# Patient Record
Sex: Male | Born: 1960 | Race: White | Hispanic: No | Marital: Married | Smoking: Former smoker
Health system: Southern US, Community
[De-identification: ages and names within clinical notes are randomized; demographics above are authoritative.]

## PROBLEM LIST (undated history)

## (undated) DIAGNOSIS — M545 Low back pain, unspecified: Secondary | ICD-10-CM

## (undated) DIAGNOSIS — M5410 Radiculopathy, site unspecified: Secondary | ICD-10-CM

## (undated) DIAGNOSIS — Z86010 Personal history of colonic polyps: Secondary | ICD-10-CM

## (undated) DIAGNOSIS — C029 Malignant neoplasm of tongue, unspecified: Secondary | ICD-10-CM

## (undated) HISTORY — DX: Personal history of colonic polyps: Z86.010

## (undated) HISTORY — DX: Low back pain: M54.5

## (undated) HISTORY — DX: Malignant neoplasm of tongue, unspecified: C02.9

## (undated) HISTORY — PX: PARTIAL GLOSSECTOMY: SHX2173

## (undated) HISTORY — PX: SPINE SURGERY: SHX786

## (undated) HISTORY — DX: Radiculopathy, site unspecified: M54.10

## (undated) HISTORY — DX: Low back pain, unspecified: M54.50

## (undated) HISTORY — PX: LUMBAR FUSION: SHX111

## (undated) HISTORY — PX: VASECTOMY: SHX75

---

## 2002-09-15 HISTORY — PX: LIPOMA EXCISION: SHX5283

## 2006-07-21 ENCOUNTER — Ambulatory Visit: Payer: Self-pay | Admitting: Family Medicine

## 2006-07-21 DIAGNOSIS — M5412 Radiculopathy, cervical region: Secondary | ICD-10-CM | POA: Insufficient documentation

## 2006-07-26 ENCOUNTER — Encounter: Payer: Self-pay | Admitting: Family Medicine

## 2006-07-26 LAB — CONVERTED CEMR LAB
Albumin: 4.6 g/dL (ref 3.5–5.2)
Alkaline Phosphatase: 42 units/L (ref 39–117)
BUN: 15 mg/dL (ref 6–23)
CO2: 26 meq/L (ref 19–32)
Cholesterol: 190 mg/dL (ref 0–200)
Glucose, Bld: 83 mg/dL (ref 70–99)
HDL: 49 mg/dL (ref >39)
LDL Cholesterol: 114 mg/dL — ABNORMAL HIGH (ref 0–99)
Potassium: 4.2 meq/L (ref 3.5–5.3)
Sodium: 141 meq/L (ref 135–145)
Total Protein: 7.1 g/dL (ref 6.0–8.3)
Triglycerides: 137 mg/dL (ref <150)

## 2006-07-28 ENCOUNTER — Encounter: Payer: Self-pay | Admitting: Family Medicine

## 2006-08-15 ENCOUNTER — Ambulatory Visit: Payer: Self-pay | Admitting: Family Medicine

## 2008-03-29 ENCOUNTER — Ambulatory Visit: Payer: Self-pay | Admitting: Family Medicine

## 2008-06-06 ENCOUNTER — Encounter: Admission: RE | Admit: 2008-06-06 | Discharge: 2008-06-06 | Payer: Self-pay | Admitting: Family Medicine

## 2008-06-06 ENCOUNTER — Ambulatory Visit: Payer: Self-pay | Admitting: Family Medicine

## 2008-06-07 ENCOUNTER — Encounter: Payer: Self-pay | Admitting: Family Medicine

## 2008-06-07 LAB — CONVERTED CEMR LAB
Albumin: 4.4 g/dL (ref 3.5–5.2)
BUN: 15 mg/dL (ref 6–23)
CO2: 24 meq/L (ref 19–32)
Calcium: 9.1 mg/dL (ref 8.4–10.5)
Chloride: 106 meq/L (ref 96–112)
Cholesterol: 180 mg/dL (ref 0–200)
Glucose, Bld: 85 mg/dL (ref 70–99)
HDL: 46 mg/dL (ref >39)
LDL Cholesterol: 98 mg/dL (ref 0–99)
Potassium: 4 meq/L (ref 3.5–5.3)
Sodium: 141 meq/L (ref 135–145)
Total Protein: 6.9 g/dL (ref 6.0–8.3)
Triglycerides: 181 mg/dL — ABNORMAL HIGH (ref <150)

## 2010-08-29 ENCOUNTER — Encounter: Payer: Self-pay | Admitting: Family Medicine

## 2010-09-05 ENCOUNTER — Ambulatory Visit (INDEPENDENT_AMBULATORY_CARE_PROVIDER_SITE_OTHER): Payer: Managed Care, Other (non HMO) | Admitting: Family Medicine

## 2010-09-05 ENCOUNTER — Encounter: Payer: Self-pay | Admitting: Family Medicine

## 2010-09-05 DIAGNOSIS — Z130 Encounter for screening for diseases of the blood and blood-forming organs and certain disorders involving the immune mechanism: Secondary | ICD-10-CM

## 2010-09-05 DIAGNOSIS — Z1211 Encounter for screening for malignant neoplasm of colon: Secondary | ICD-10-CM

## 2010-09-05 DIAGNOSIS — Z125 Encounter for screening for malignant neoplasm of prostate: Secondary | ICD-10-CM

## 2010-09-05 DIAGNOSIS — Z0000 Encounter for general adult medical examination without abnormal findings: Secondary | ICD-10-CM

## 2010-09-05 DIAGNOSIS — Z13220 Encounter for screening for lipoid disorders: Secondary | ICD-10-CM

## 2010-09-05 DIAGNOSIS — Z1283 Encounter for screening for malignant neoplasm of skin: Secondary | ICD-10-CM

## 2010-09-05 NOTE — Progress Notes (Signed)
  Subjective:    Patient ID: Nathan Stephenson, male    DOB: 05/15/1960, 50 y.o.   MRN: [id number]  HPI  50 yo WM presents for CPE.  He is due for his first screening colonoscopy in [place].  He is previously healthy.  His BP is high for the first time today.  He has gained some more weight.  He is due for prostate cancer screen and fasting labs.  He denies fam hx of heart dz or stroke.  He is not a smoker.  Denies any problems with CP or DOE.  BP 151/87  Pulse 55  Ht 6\' 1"  (1.854 m)  Wt 261 lb (118.389 kg)  BMI 34.43 kg/m2     Review of Systems Gen: no fevers, chills, hot flashes, night sweats, change in weight GI: no N/V/C/D GU: no dysuria, incontinence or sexual dysfunction CV: no chest pain, DOE, palpitations s or edema Pulm:  Denies CP, SOB or chronic cough     Objective:   Physical Exam  Gen: alert, well groomed in NAD Neck: no thyromegaly or cervical lymphadenopathy CV: RRR w/o murmur, no audible carotid bruits or abdominal aortic bruits Ext: no edema, clubbing or cyanosis Lungs: CTA bilat w/o W/R/R; nonlabored HEENT:  [place]/AT; PERRLA; oropharynx pink and moist with good dentition Abd: soft, NT, ND, NABS, No HSM, no audible AA bruits Skin: warm and dry; no rash, pallor or jaundice, AKs around hair line. Psych: does not appear anxious or depressed; answers questions appropriately GU: hemoccult neg w/o anal masses, prostate is not enlarged.  No masses or tenderness.     Assessment & Plan:  Assesment:  1. CPE- Keeping healthy checklist for men  reviewed today.  BP high today for 1st time.l.  BMI 34   in the class I obesity range.     Labs ordered Colonoscopy ordered. Will return for a Tdap and flu shot in October. DRE today/ PSA ordered. Encouraged healthy diet, regular exercise, MVI daily. Return for next physical in 1 yr.

## 2010-09-05 NOTE — Patient Instructions (Addendum)
Update fasting labs one morning downstairs. Will call you w/ results.  Will set up colonoscopy with [facility] Will refer you to derm in [place] for skin cancer screening.  Work on [employer], regular exercise, wt loss.  Return in October for nurse visit: Tdap and flu shot.

## 2010-10-12 LAB — COMPLETE METABOLIC PANEL WITH GFR
Albumin: 4.5 g/dL (ref 3.5–5.2)
Alkaline Phosphatase: 40 U/L (ref 39–117)
BUN: 13 mg/dL (ref 6–23)
CO2: 26 mEq/L (ref 19–32)
Calcium: 9.4 mg/dL (ref 8.4–10.5)
Chloride: 104 mEq/L (ref 96–112)
GFR, Est Non African American: >60 mL/min (ref >60)
Glucose, Bld: 98 mg/dL (ref 70–99)
Potassium: 4.5 mEq/L (ref 3.5–5.3)
Sodium: 141 mEq/L (ref 135–145)
Total Protein: 7 g/dL (ref 6.0–8.3)

## 2010-10-12 LAB — LIPID PANEL: Total CHOL/HDL Ratio: 3.9 Ratio

## 2010-10-19 ENCOUNTER — Encounter: Payer: Self-pay | Admitting: Family Medicine

## 2010-10-23 ENCOUNTER — Encounter: Payer: Self-pay | Admitting: Family Medicine

## 2012-03-09 ENCOUNTER — Ambulatory Visit (INDEPENDENT_AMBULATORY_CARE_PROVIDER_SITE_OTHER): Payer: Managed Care, Other (non HMO) | Admitting: Family Medicine

## 2012-03-09 ENCOUNTER — Encounter: Payer: Self-pay | Admitting: Family Medicine

## 2012-03-09 VITALS — BP 130/83 | HR 99 | Ht 73.0 in | Wt 271.0 lb

## 2012-03-09 DIAGNOSIS — M5412 Radiculopathy, cervical region: Secondary | ICD-10-CM

## 2012-03-09 DIAGNOSIS — E785 Hyperlipidemia, unspecified: Secondary | ICD-10-CM

## 2012-03-09 DIAGNOSIS — Z860100 Personal history of colon polyps, unspecified: Secondary | ICD-10-CM

## 2012-03-09 DIAGNOSIS — L570 Actinic keratosis: Secondary | ICD-10-CM | POA: Insufficient documentation

## 2012-03-09 DIAGNOSIS — Z131 Encounter for screening for diabetes mellitus: Secondary | ICD-10-CM

## 2012-03-09 DIAGNOSIS — Z0000 Encounter for general adult medical examination without abnormal findings: Secondary | ICD-10-CM

## 2012-03-09 HISTORY — DX: Personal history of colonic polyps: Z86.010

## 2012-03-09 HISTORY — DX: Personal history of colon polyps, unspecified: Z86.0100

## 2012-03-09 NOTE — Patient Instructions (Signed)
Dr. Romel Dumond's General Advice Following Your Complete Physical Exam  The Benefits of Regular Exercise: Unless you suffer from an uncontrolled cardiovascular condition, studies strongly suggest that regular exercise and physical activity will add to both the quality and length of your life.  The World Health Organization recommends 150 minutes of moderate intensity aerobic activity every week.  This is best split over 3-4 days a week, and can be as simple as a brisk walk for just over 35 minutes "most days of the week".  This type of exercise has been shown to lower LDL-Cholesterol, lower average blood sugars, lower blood pressure, lower cardiovascular disease risk, improve memory, and increase one's overall sense of wellbeing.  The addition of anaerobic (or "strength training") exercises offers additional benefits including but not limited to increased metabolism, prevention of osteoporosis, and improved overall cholesterol levels.  How Can I Strive For A Low-Fat Diet?: Current guidelines recommend that 25-35 percent of your daily energy (food) intake should come from fats.  One might ask how can this be achieved without having to dissect each meal on a daily basis?  Switch to skim or 1% milk instead of whole milk.  Focus on lean meats such as ground turkey, fresh fish, baked chicken, and lean cuts of beef as your source of dietary protein.  Consume less than 300mg/day of dietary cholesterol.  Limit trans fatty acid consumption primarily by limiting synthetic trans fats such as partially hydrogenated oils (Ex: fried fast foods).  Focus efforts on reducing your intake of "solid" fats (Ex: Butter).  Substitute olive or vegetable oil for solid fats where possible.  Moderation of Salt Intake: Provided you don't carry a diagnosis of congestive heart failure nor renal failure, I recommend a daily allowance of no more than 2300 mg of salt (sodium).  Keeping under this daily goal is associated with a  decreased risk of cardiovascular events, creeping above it can lead to elevated blood pressures and increases your risk of cardiovascular events.  Milligrams (mg) of salt is listed on all nutrition labels, and your daily intake can add up faster than you think.  Most canned and frozen dinners can pack in over half your daily salt allowance in one meal.    Lifestyle Health Risks: Certain lifestyle choices carry specific health risks.  As you may already know, tobacco use has been associated with increasing one's risk of cardiovascular disease, pulmonary disease, numerous cancers, among many other issues.  What you may not know is that there are medications and nicotine replacement strategies that can more than double your chances of successfully quitting.  I would be thrilled to help manage your quitting strategy if you currently use tobacco products.  When it comes to alcohol use, I've yet to find an "ideal" daily allowance.  Provided an individual does not have a medical condition that is exacerbated by alcohol consumption, general guidelines determine "safe drinking" as no more than two standard drinks for a man or no more than one standard drink for a male per day.  However, much debate still exists on whether any amount of alcohol consumption is technically "safe".  My general advice, keep alcohol consumption to a minimum for general health promotion.  If you or others believe that alcohol, tobacco, or recreational drug use is interfering with your life, I would be happy to provide confidential counseling regarding treatment options.  General "Over The Counter" Nutrition Advice: Postmenopausal women should aim for a daily calcium intake of 1200 mg, however a significant   portion of this might already be provided by diets including milk, yogurt, cheese, and other dairy products.  Vitamin D has been shown to help preserve bone density, prevent fatigue, and has even been shown to help reduce falls in the  elderly.  Ensuring a daily intake of 800 Units of Vitamin D is a good place to start to enjoy the above benefits, we can easily check your Vitamin D level to see if you'd potentially benefit from supplementation beyond 800 Units a day.  Folic Acid intake should be of particular concern to women of childbearing age.  Daily consumption of 400-800 mcg of Folic Acid is recommended to minimize the chance of spinal cord defects in a fetus should pregnancy occur.    For many adults, accidents still remain one of the most common culprits when it comes to cause of death.  Some of the simplest but most effective preventitive habits you can adopt include regular seatbelt use, proper helmet use, securing firearms, and regularly testing your smoke and carbon monoxide detectors.  Jerron Niblack B. Naturi Alarid DO [facility] [address], [address] [place], [place] [address] Phone: [phone]  

## 2012-03-09 NOTE — Progress Notes (Signed)
CC: Nathan Stephenson is a 52 y.o. male is here for Annual Exam   Subjective: HPI:  Colonoscopy:10/18/10 Repeat 10/17/2013 Prostate: PSA 0.5 Sept 2012 Discussed screening risks/beneifts with patient on 03/09/2012.  Will hold off on PSA at today's visit.  Influenza Vaccine: declined 03/09/2012 Pneumovax: not indicated until 64 Td/Tdap: Td received 2007 Zoster: will receive at age 9   Patient here for CPE with no acute complaints. Rare alcohol use, no tobacco use, no recreational drug use. No formal exercise plan, tries to watch what he eats   Review of Systems - General ROS: negative for - chills, fever, night sweats, weight gain or weight loss Ophthalmic ROS: negative for - decreased vision Psychological ROS: negative for - anxiety or depression ENT ROS: negative for - hearing change, nasal congestion, tinnitus or allergies Hematological and Lymphatic ROS: negative for - bleeding problems, bruising or swollen lymph nodes Breast ROS: negative Respiratory ROS: no cough, shortness of breath, or wheezing Cardiovascular ROS: no chest pain or dyspnea on exertion Gastrointestinal ROS: no abdominal pain, change in bowel habits, or black or bloody stools Genito-Urinary ROS: negative for - genital discharge, genital ulcers, incontinence or abnormal bleeding from genitals Musculoskeletal ROS: negative for - joint pain or muscle pain Neurological ROS: negative for - headaches or memory loss Dermatological ROS: negative for lumps, mole changes, rash and skin lesion changes   Past Medical History  Diagnosis Date  . Radicular pain     neck  . Personal history of colonic polyps 03/09/2012     Family History  Problem Relation Age of Onset  . Hypertension Mother   . Diabetes Brother      History  Substance Use Topics  . Smoking status: Never Smoker   . Smokeless tobacco: Not on file  . Alcohol Use: 0.5 oz/week    1 drink(s) per week     Objective: Filed Vitals:   03/09/12 1108  BP:  130/83  Pulse: 99   General: No Acute Distress HEENT: Atraumatic, normocephalic, conjunctivae normal without scleral icterus.  No nasal discharge, hearing grossly intact, TMs with good landmarks bilaterally with no middle ear abnormalities, posterior pharynx clear without oral lesions. Neck: Supple, trachea midline, no cervical nor supraclavicular adenopathy. Pulmonary: Clear to auscultation bilaterally without wheezing, rhonchi, nor rales. Cardiac: Regular rate and rhythm.  No murmurs, rubs, nor gallops. No peripheral edema.  2+ peripheral pulses bilaterally. Abdomen: Bowel sounds normal.  No masses.  Non-tender without rebound.  Negative Murphy's sign. GU: Declined MSK: Grossly intact, no signs of weakness.  Full strength throughout upper and lower extremities.  Full ROM in upper and lower extremities.  No midline spinal tenderness. Neuro: Gait unremarkable, CN II-XII grossly intact.  C5-C6 Reflex 2/4 Bilaterally, L4 Reflex 2/4 Bilaterally.  Cerebellar function intact. Skin: No rashes. Mild actinic keratosis right temple Psych: Alert and oriented to person/place/time.  Thought process normal. No anxiety/depression.  Assessment & Plan: Nathan Stephenson was seen today for annual exam.  Diagnoses and associated orders for this visit:  Hyperlipidemia - Lipid panel  Diabetes mellitus screening - BASIC METABOLIC PANEL WITH GFR  Personal history of colonic polyps  Actinic keratosis  CERVICAL RADICULOPATHY    If [place] 10 year risk is 4% or greater Will encourage to start on aspirin. Due for cholesterol screening and screening for type 2 diabetes. He was given stool cards. Discussed returning within 3 months for possible cryotherapy of actinic keratosis on right temple Discussed healthy lifestyle options, diet interventions, exercise interventions to promote wellness  and overall health.  Return in about 3 months (around 06/06/2012).

## 2012-03-10 ENCOUNTER — Telehealth: Payer: Self-pay | Admitting: Family Medicine

## 2012-03-10 LAB — BASIC METABOLIC PANEL WITH GFR
BUN: 12 mg/dL (ref 6–23)
CO2: 29 mEq/L (ref 19–32)
Calcium: 9.5 mg/dL (ref 8.4–10.5)
Chloride: 106 mEq/L (ref 96–112)
Creat: 0.91 mg/dL (ref 0.50–1.35)
GFR, Est Non African American: >89 mL/min

## 2012-03-10 LAB — LIPID PANEL
Cholesterol: 188 mg/dL (ref 0–200)
HDL: 53 mg/dL (ref >39)
Triglycerides: 175 mg/dL — ABNORMAL HIGH (ref <150)
VLDL: 35 mg/dL (ref 0–40)

## 2012-03-10 NOTE — Telephone Encounter (Signed)
Left message to call back  

## 2012-03-10 NOTE — Telephone Encounter (Signed)
Pt notified and voiced understanding 

## 2012-03-10 NOTE — Telephone Encounter (Signed)
Nathan Stephenson, Will you please let Nathan Stephenson know that his cholesterol overall looks pretty good.  His triglycerides were a bit over the upper limit of normal but not to the degree where he needs to consider new medication.  These can easily be lowered with cutting back on saturated fats and increasing weekly cardiovascular exercise, optimally 30-45 minutes of breaking a sweat most days of the week.  His risk of having a heart attack in the next 10 years is estimated to be 4% and while this is quite small it's enough to where he would benefit from taking a baby aspirin on a daily basis.  I'd like him to f/u at his convenience this summer to check his skin on the right temple.

## 2015-02-07 ENCOUNTER — Encounter: Payer: Self-pay | Admitting: Family Medicine

## 2015-02-07 ENCOUNTER — Ambulatory Visit (INDEPENDENT_AMBULATORY_CARE_PROVIDER_SITE_OTHER): Payer: BLUE CROSS/BLUE SHIELD | Admitting: Family Medicine

## 2015-02-07 VITALS — BP 130/85 | HR 64 | Wt 262.0 lb

## 2015-02-07 DIAGNOSIS — Z0000 Encounter for general adult medical examination without abnormal findings: Secondary | ICD-10-CM | POA: Diagnosis not present

## 2015-02-07 DIAGNOSIS — R358 Other polyuria: Secondary | ICD-10-CM | POA: Diagnosis not present

## 2015-02-07 DIAGNOSIS — M5137 Other intervertebral disc degeneration, lumbosacral region: Secondary | ICD-10-CM | POA: Diagnosis not present

## 2015-02-07 DIAGNOSIS — R3589 Other polyuria: Secondary | ICD-10-CM

## 2015-02-07 NOTE — Progress Notes (Signed)
CC: Nathan Stephenson is a 55 y.o. male is here for Annual Exam and Back Pain   Subjective: HPI:  Colonoscopy:10/18/10 Repeat 2015 normal and 5 year clearance provided. Prostate: PSA 0.5 Sept 2012 Discussed screening risks/beneifts with patient today,obtain PSA  Influenza Vaccine: declined Pneumovax: not indicated until 46 Td/Tdap: Td received 2007, up-to-date until the end of the year Zoster: will receive at age 3  He is requesting a referral to Dr. Harl Bowie at [facility] for consideration of back surgery.  Review of Systems - General ROS: negative for - chills, fever, night sweats, weight gain or weight loss Ophthalmic ROS: negative for - decreased vision Psychological ROS: negative for - anxiety or depression ENT ROS: negative for - hearing change, nasal congestion, tinnitus or allergies Hematological and Lymphatic ROS: negative for - bleeding problems, bruising or swollen lymph nodes Breast ROS: negative Respiratory ROS: no cough, shortness of breath, or wheezing Cardiovascular ROS: no chest pain or dyspnea on exertion Gastrointestinal ROS: no abdominal pain, change in bowel habits, or black or bloody stools Genito-Urinary ROS: negative for - genital discharge, genital ulcers, incontinence or abnormal bleeding from genitals, positive for frequent urination Musculoskeletal ROS: negative for - joint pain or muscle pain other than that described above Neurological ROS: negative for - headaches or memory loss Dermatological ROS: negative for lumps, mole changes, rash and skin lesion changes  Past Medical History  Diagnosis Date  . Radicular pain     neck  . Personal history of colonic polyps 03/09/2012    Past Surgical History  Procedure Laterality Date  . Lipoma excision  09/2002  . Vasectomy     Family History  Problem Relation Age of Onset  . Hypertension Mother   . Diabetes Brother     Social History   Social History  . Marital Status: Married    Spouse Name: N/A  .  Number of Children: N/A  . Years of Education: N/A   Occupational History  . Not on file.   Social History Main Topics  . Smoking status: Never Smoker   . Smokeless tobacco: Not on file  . Alcohol Use: 0.5 oz/week    1 drink(s) per week  . Drug Use: No  . Sexual Activity: Not on file   Other Topics Concern  . Not on file   Social History Narrative     Objective: BP 130/85 mmHg  Pulse 64  Wt 262 lb (118.842 kg)  General: No Acute Distress HEENT: Atraumatic, normocephalic, conjunctivae normal without scleral icterus.  No nasal discharge, hearing grossly intact, TMs with good landmarks bilaterally with no middle ear abnormalities, posterior pharynx clear without oral lesions. Neck: Supple, trachea midline, no cervical nor supraclavicular adenopathy. Pulmonary: Clear to auscultation bilaterally without wheezing, rhonchi, nor rales. Cardiac: Regular rate and rhythm.  No murmurs, rubs, nor gallops. No peripheral edema.  2+ peripheral pulses bilaterally. Abdomen: Bowel sounds normal.  No masses.  Non-tender without rebound.  Negative Murphy's sign. MSK: Grossly intact, no signs of weakness.  Full strength throughout upper and lower extremities.  Full ROM in upper and lower extremities.  No midline spinal tenderness. Neuro: Gait unremarkable, CN II-XII grossly intact.  C5-C6 Reflex 2/4 Bilaterally, L4 Reflex 2/4 Bilaterally.  Cerebellar function intact. Skin: No rashes. Psych: Alert and oriented to person/place/time.  Thought process normal. No anxiety/depression.  Assessment & Plan: [name] was seen today for annual exam and back pain.  Diagnoses and all orders for this visit:  Annual physical exam -  Lipid panel -     COMPLETE METABOLIC PANEL WITH GFR -     CBC -     PSA -     Urinalysis, Routine w reflex microscopic  Degeneration of lumbar or lumbosacral intervertebral disc -     Ambulatory referral to Neurosurgery  Polyuria -     Urinalysis, Routine w reflex  microscopic  Healthy lifestyle interventions including but not limited to regular exercise, a healthy low fat diet, moderation of salt intake, the dangers of tobacco/alcohol/recreational drug use, nutrition supplementation, and accident avoidance were discussed with the patient and a handout was provided for future reference.  Return for Follow up will be baased on your lab results today.Marland Kitchen

## 2015-02-08 ENCOUNTER — Telehealth: Payer: Self-pay | Admitting: Family Medicine

## 2015-02-08 LAB — CBC
HEMATOCRIT: 51.6 % (ref 39.0–52.0)
HEMOGLOBIN: 17.4 g/dL — AB (ref 13.0–17.0)
MCH: 30.6 pg (ref 26.0–34.0)
MCHC: 33.7 g/dL (ref 30.0–36.0)
MCV: 90.7 fL (ref 78.0–100.0)
MPV: 11.6 fL (ref 8.6–12.4)
Platelets: 190 10*3/uL (ref 150–400)
RBC: 5.69 MIL/uL (ref 4.22–5.81)
RDW: 13.5 % (ref 11.5–15.5)
WBC: 5.2 10*3/uL (ref 4.0–10.5)

## 2015-02-08 LAB — COMPLETE METABOLIC PANEL WITH GFR
ALT: 19 U/L (ref 9–46)
AST: 19 U/L (ref 10–35)
Albumin: 4.8 g/dL (ref 3.6–5.1)
Alkaline Phosphatase: 39 U/L — ABNORMAL LOW (ref 40–115)
BUN: 13 mg/dL (ref 7–25)
CALCIUM: 9.5 mg/dL (ref 8.6–10.3)
CHLORIDE: 101 mmol/L (ref 98–110)
CO2: 33 mmol/L — AB (ref 20–31)
CREATININE: 0.87 mg/dL (ref 0.70–1.33)
Glucose, Bld: 87 mg/dL (ref 65–99)
POTASSIUM: 4.7 mmol/L (ref 3.5–5.3)
Sodium: 139 mmol/L (ref 135–146)
Total Bilirubin: 1.3 mg/dL — ABNORMAL HIGH (ref 0.2–1.2)
Total Protein: 7.2 g/dL (ref 6.1–8.1)

## 2015-02-08 LAB — LIPID PANEL
CHOL/HDL RATIO: 3.4 ratio (ref <=5.0)
CHOLESTEROL: 207 mg/dL — AB (ref 125–200)
HDL: 61 mg/dL (ref >=40)
LDL CALC: 121 mg/dL (ref <130)
TRIGLYCERIDES: 123 mg/dL (ref <150)
VLDL: 25 mg/dL (ref <30)

## 2015-02-08 LAB — URINALYSIS, ROUTINE W REFLEX MICROSCOPIC
Bilirubin Urine: NEGATIVE
Glucose, UA: NEGATIVE
HGB URINE DIPSTICK: NEGATIVE
KETONES UR: NEGATIVE
LEUKOCYTES UA: NEGATIVE
NITRITE: NEGATIVE
Protein, ur: NEGATIVE
SPECIFIC GRAVITY, URINE: 1.018 (ref 1.001–1.035)
pH: 5.5 (ref 5.0–8.0)

## 2015-02-08 LAB — PSA: PSA: 0.68 ng/mL (ref <=4.00)

## 2015-02-08 MED ORDER — TAMSULOSIN HCL 0.4 MG PO CAPS: 0.4000 mg | ORAL_CAPSULE | Freq: Every day | ORAL | Status: DC

## 2015-02-08 NOTE — Telephone Encounter (Signed)
Will you please let patient know that his PSA prostate test, blood sugar, kidney function, liver function, blood cell counts and cholesterol were all normal.  Also since his urine test was normal I suspect his frequent urination is due to an enlarged prostate which is common for men his age.  I'd recommend starting a medication called tamsulosin to help with this. I'll send it to [facility].  I'd recommend f/u in three months.

## 2015-02-08 NOTE — Telephone Encounter (Signed)
Notified patient of lab results and starting the recommended medication ordered.  He asked about side effects and I read them to him.  He said he would try it.  I told him he needs to follow up in 3 months. He would like for a referral to Dr. Harl Bowie in [place].  He said that he discussed this with you yesterday, and he would get in quicker if you referred him.

## 2015-02-28 DIAGNOSIS — M5137 Other intervertebral disc degeneration, lumbosacral region: Secondary | ICD-10-CM | POA: Insufficient documentation

## 2015-04-27 DIAGNOSIS — Z01810 Encounter for preprocedural cardiovascular examination: Secondary | ICD-10-CM | POA: Diagnosis not present

## 2015-04-27 DIAGNOSIS — R9431 Abnormal electrocardiogram [ECG] [EKG]: Secondary | ICD-10-CM | POA: Diagnosis not present

## 2015-04-27 DIAGNOSIS — R001 Bradycardia, unspecified: Secondary | ICD-10-CM | POA: Diagnosis not present

## 2015-04-27 DIAGNOSIS — M5137 Other intervertebral disc degeneration, lumbosacral region: Secondary | ICD-10-CM | POA: Diagnosis not present

## 2015-05-04 DIAGNOSIS — M532X6 Spinal instabilities, lumbar region: Secondary | ICD-10-CM | POA: Diagnosis not present

## 2015-05-04 DIAGNOSIS — Z6833 Body mass index (BMI) 33.0-33.9, adult: Secondary | ICD-10-CM | POA: Diagnosis not present

## 2015-05-04 DIAGNOSIS — M4005 Postural kyphosis, thoracolumbar region: Secondary | ICD-10-CM | POA: Diagnosis not present

## 2015-05-04 DIAGNOSIS — E669 Obesity, unspecified: Secondary | ICD-10-CM | POA: Diagnosis not present

## 2015-05-04 DIAGNOSIS — M5136 Other intervertebral disc degeneration, lumbar region: Secondary | ICD-10-CM | POA: Diagnosis not present

## 2015-05-04 DIAGNOSIS — M4150 Other secondary scoliosis, site unspecified: Secondary | ICD-10-CM | POA: Diagnosis not present

## 2015-05-04 DIAGNOSIS — Z791 Long term (current) use of non-steroidal anti-inflammatories (NSAID): Secondary | ICD-10-CM | POA: Diagnosis not present

## 2015-05-04 DIAGNOSIS — M40205 Unspecified kyphosis, thoracolumbar region: Secondary | ICD-10-CM | POA: Diagnosis not present

## 2015-05-04 DIAGNOSIS — M5137 Other intervertebral disc degeneration, lumbosacral region: Secondary | ICD-10-CM | POA: Diagnosis not present

## 2015-05-05 DIAGNOSIS — M4180 Other forms of scoliosis, site unspecified: Secondary | ICD-10-CM | POA: Insufficient documentation

## 2015-05-05 DIAGNOSIS — M4150 Other secondary scoliosis, site unspecified: Secondary | ICD-10-CM | POA: Insufficient documentation

## 2015-07-03 DIAGNOSIS — C4441 Basal cell carcinoma of skin of scalp and neck: Secondary | ICD-10-CM | POA: Diagnosis not present

## 2015-07-03 DIAGNOSIS — L821 Other seborrheic keratosis: Secondary | ICD-10-CM | POA: Diagnosis not present

## 2015-07-03 DIAGNOSIS — L570 Actinic keratosis: Secondary | ICD-10-CM | POA: Diagnosis not present

## 2015-07-12 DIAGNOSIS — Z981 Arthrodesis status: Secondary | ICD-10-CM | POA: Diagnosis not present

## 2015-07-12 DIAGNOSIS — M532X6 Spinal instabilities, lumbar region: Secondary | ICD-10-CM | POA: Diagnosis not present

## 2015-07-12 DIAGNOSIS — Z4789 Encounter for other orthopedic aftercare: Secondary | ICD-10-CM | POA: Diagnosis not present

## 2015-10-11 DIAGNOSIS — M9905 Segmental and somatic dysfunction of pelvic region: Secondary | ICD-10-CM | POA: Diagnosis not present

## 2015-10-11 DIAGNOSIS — M25551 Pain in right hip: Secondary | ICD-10-CM | POA: Diagnosis not present

## 2015-10-11 DIAGNOSIS — Q7649 Other congenital malformations of spine, not associated with scoliosis: Secondary | ICD-10-CM | POA: Diagnosis not present

## 2015-10-11 DIAGNOSIS — M9903 Segmental and somatic dysfunction of lumbar region: Secondary | ICD-10-CM | POA: Diagnosis not present

## 2015-10-16 DIAGNOSIS — M9905 Segmental and somatic dysfunction of pelvic region: Secondary | ICD-10-CM | POA: Diagnosis not present

## 2015-10-16 DIAGNOSIS — M9904 Segmental and somatic dysfunction of sacral region: Secondary | ICD-10-CM | POA: Diagnosis not present

## 2015-10-16 DIAGNOSIS — M9903 Segmental and somatic dysfunction of lumbar region: Secondary | ICD-10-CM | POA: Diagnosis not present

## 2015-10-16 DIAGNOSIS — M9902 Segmental and somatic dysfunction of thoracic region: Secondary | ICD-10-CM | POA: Diagnosis not present

## 2015-10-16 DIAGNOSIS — Q7649 Other congenital malformations of spine, not associated with scoliosis: Secondary | ICD-10-CM | POA: Diagnosis not present

## 2015-10-16 DIAGNOSIS — M25551 Pain in right hip: Secondary | ICD-10-CM | POA: Diagnosis not present

## 2015-10-18 DIAGNOSIS — Q7649 Other congenital malformations of spine, not associated with scoliosis: Secondary | ICD-10-CM | POA: Diagnosis not present

## 2015-10-18 DIAGNOSIS — M9905 Segmental and somatic dysfunction of pelvic region: Secondary | ICD-10-CM | POA: Diagnosis not present

## 2015-10-18 DIAGNOSIS — M25551 Pain in right hip: Secondary | ICD-10-CM | POA: Diagnosis not present

## 2015-10-18 DIAGNOSIS — M546 Pain in thoracic spine: Secondary | ICD-10-CM | POA: Diagnosis not present

## 2015-10-18 DIAGNOSIS — M9902 Segmental and somatic dysfunction of thoracic region: Secondary | ICD-10-CM | POA: Diagnosis not present

## 2015-10-18 DIAGNOSIS — M9904 Segmental and somatic dysfunction of sacral region: Secondary | ICD-10-CM | POA: Diagnosis not present

## 2015-10-18 DIAGNOSIS — M9903 Segmental and somatic dysfunction of lumbar region: Secondary | ICD-10-CM | POA: Diagnosis not present

## 2016-01-01 DIAGNOSIS — M438X6 Other specified deforming dorsopathies, lumbar region: Secondary | ICD-10-CM | POA: Diagnosis not present

## 2016-01-01 DIAGNOSIS — L570 Actinic keratosis: Secondary | ICD-10-CM | POA: Diagnosis not present

## 2016-01-01 DIAGNOSIS — M545 Low back pain: Secondary | ICD-10-CM | POA: Diagnosis not present

## 2016-01-01 DIAGNOSIS — M461 Sacroiliitis, not elsewhere classified: Secondary | ICD-10-CM | POA: Diagnosis not present

## 2016-01-01 DIAGNOSIS — L821 Other seborrheic keratosis: Secondary | ICD-10-CM | POA: Diagnosis not present

## 2016-01-01 DIAGNOSIS — Z85828 Personal history of other malignant neoplasm of skin: Secondary | ICD-10-CM | POA: Diagnosis not present

## 2016-01-01 DIAGNOSIS — J019 Acute sinusitis, unspecified: Secondary | ICD-10-CM | POA: Diagnosis not present

## 2016-01-01 DIAGNOSIS — J3489 Other specified disorders of nose and nasal sinuses: Secondary | ICD-10-CM | POA: Diagnosis not present

## 2016-01-01 DIAGNOSIS — Z981 Arthrodesis status: Secondary | ICD-10-CM | POA: Diagnosis not present

## 2016-01-01 DIAGNOSIS — R05 Cough: Secondary | ICD-10-CM | POA: Diagnosis not present

## 2016-01-01 DIAGNOSIS — Z08 Encounter for follow-up examination after completed treatment for malignant neoplasm: Secondary | ICD-10-CM | POA: Diagnosis not present

## 2016-01-01 DIAGNOSIS — R0981 Nasal congestion: Secondary | ICD-10-CM | POA: Diagnosis not present

## 2016-02-08 DIAGNOSIS — M545 Low back pain: Secondary | ICD-10-CM | POA: Diagnosis not present

## 2016-02-08 DIAGNOSIS — G894 Chronic pain syndrome: Secondary | ICD-10-CM | POA: Diagnosis not present

## 2016-02-08 DIAGNOSIS — M5137 Other intervertebral disc degeneration, lumbosacral region: Secondary | ICD-10-CM | POA: Diagnosis not present

## 2016-02-08 DIAGNOSIS — M533 Sacrococcygeal disorders, not elsewhere classified: Secondary | ICD-10-CM | POA: Diagnosis not present

## 2016-02-27 DIAGNOSIS — M545 Low back pain: Secondary | ICD-10-CM | POA: Diagnosis not present

## 2016-02-27 DIAGNOSIS — M461 Sacroiliitis, not elsewhere classified: Secondary | ICD-10-CM | POA: Diagnosis not present

## 2016-02-27 DIAGNOSIS — M533 Sacrococcygeal disorders, not elsewhere classified: Secondary | ICD-10-CM | POA: Diagnosis not present

## 2016-04-10 DIAGNOSIS — Z981 Arthrodesis status: Secondary | ICD-10-CM | POA: Diagnosis not present

## 2016-04-10 DIAGNOSIS — M4186 Other forms of scoliosis, lumbar region: Secondary | ICD-10-CM | POA: Diagnosis not present

## 2016-04-10 DIAGNOSIS — M47896 Other spondylosis, lumbar region: Secondary | ICD-10-CM | POA: Diagnosis not present

## 2016-05-15 ENCOUNTER — Ambulatory Visit (INDEPENDENT_AMBULATORY_CARE_PROVIDER_SITE_OTHER): Payer: BLUE CROSS/BLUE SHIELD | Admitting: Physician Assistant

## 2016-05-15 ENCOUNTER — Encounter: Payer: Self-pay | Admitting: Physician Assistant

## 2016-05-15 VITALS — BP 114/80 | HR 59 | Wt 262.0 lb

## 2016-05-15 DIAGNOSIS — Z0000 Encounter for general adult medical examination without abnormal findings: Secondary | ICD-10-CM | POA: Diagnosis not present

## 2016-05-15 DIAGNOSIS — N529 Male erectile dysfunction, unspecified: Secondary | ICD-10-CM | POA: Diagnosis not present

## 2016-05-15 DIAGNOSIS — Z6834 Body mass index (BMI) 34.0-34.9, adult: Secondary | ICD-10-CM | POA: Diagnosis not present

## 2016-05-15 DIAGNOSIS — E789 Disorder of lipoprotein metabolism, unspecified: Secondary | ICD-10-CM

## 2016-05-15 DIAGNOSIS — Z23 Encounter for immunization: Secondary | ICD-10-CM | POA: Diagnosis not present

## 2016-05-15 DIAGNOSIS — E6609 Other obesity due to excess calories: Secondary | ICD-10-CM | POA: Diagnosis not present

## 2016-05-15 DIAGNOSIS — E66811 Obesity, class 1: Secondary | ICD-10-CM

## 2016-05-15 LAB — COMPLETE METABOLIC PANEL WITH GFR
ALT: 18 U/L (ref 9–46)
AST: 21 U/L (ref 10–35)
Albumin: 4.4 g/dL (ref 3.6–5.1)
Alkaline Phosphatase: 35 U/L — ABNORMAL LOW (ref 40–115)
BUN: 19 mg/dL (ref 7–25)
CALCIUM: 9.4 mg/dL (ref 8.6–10.3)
CHLORIDE: 104 mmol/L (ref 98–110)
CO2: 23 mmol/L (ref 20–31)
CREATININE: 1 mg/dL (ref 0.70–1.33)
GFR, Est African American: >89 mL/min (ref >=60)
GFR, Est Non African American: 84 mL/min (ref >=60)
Glucose, Bld: 90 mg/dL (ref 65–99)
Potassium: 5.3 mmol/L (ref 3.5–5.3)
Sodium: 142 mmol/L (ref 135–146)
TOTAL PROTEIN: 6.7 g/dL (ref 6.1–8.1)
Total Bilirubin: 1.3 mg/dL — ABNORMAL HIGH (ref 0.2–1.2)

## 2016-05-15 LAB — LIPID PANEL W/REFLEX DIRECT LDL
CHOL/HDL RATIO: 2.9 ratio (ref <5.0)
Cholesterol: 209 mg/dL — ABNORMAL HIGH (ref <200)
HDL: 72 mg/dL (ref >40)
LDL-Cholesterol: 115 mg/dL — ABNORMAL HIGH
NON-HDL CHOLESTEROL (CALC): 137 mg/dL — AB (ref <130)
Triglycerides: 112 mg/dL (ref <150)

## 2016-05-15 LAB — CBC
HEMATOCRIT: 50.3 % — AB (ref 38.5–50.0)
HEMOGLOBIN: 17.1 g/dL (ref 13.2–17.1)
MCH: 31.1 pg (ref 27.0–33.0)
MCHC: 34 g/dL (ref 32.0–36.0)
MCV: 91.5 fL (ref 80.0–100.0)
MPV: 10.8 fL (ref 7.5–12.5)
Platelets: 170 10*3/uL (ref 140–400)
RBC: 5.5 MIL/uL (ref 4.20–5.80)
RDW: 13.9 % (ref 11.0–15.0)
WBC: 4.3 10*3/uL (ref 3.8–10.8)

## 2016-05-15 MED ORDER — SILDENAFIL CITRATE 50 MG PO TABS: 25.0000 mg | ORAL_TABLET | Freq: Every day | ORAL | 0 refills | Status: DC | PRN

## 2016-05-15 NOTE — Patient Instructions (Addendum)
Start baby aspirin (81mg ) daily  For ED: - Take 1/2 tablet (25mg ) of Sildenafil 1 hour prior to sexual activity - Drink plenty of water   Physical Activity Recommendations for modifying lipids and lowering blood pressure Engage in aerobic physical activity to reduce LDL-cholesterol, non-HDL-cholesterol, and blood pressure  Frequency: 3-4 sessions per week  Intensity: moderate to vigorous  Duration: 40 minutes on average  Physical Activity Recommendations for secondary prevention 1. Aerobic exercise  Frequency: 3-5 sessions per week  Intensity: 50-80% capacity  Duration: 20 - 60 minutes  Examples: walking, treadmill, cycling, rowing, stair climbing, and arm/leg ergometry  2. Resistance exercise  Frequency: 2-3 sessions per week  Intensity: 10-15 repetitions/set to moderate fatigue  Duration: 1-3 sets of 8-10 upper and lower body exercises  Examples: calisthenics, elastic bands, cuff/hand weights, dumbbels, free weights, wall pulleys, and weight machines  Heart-Healthy Lifestyle  Eating a diet rich in vegetables, fruits and whole grains: also includes low-fat dairy products, poultry, fish, legumes, and nuts; limit intake of sweets, sugar-sweetened beverages and red meats  Getting regular exercise  Maintaining a healthy weight  Not smoking or getting help quitting  Staying on top of your health; for some people, lifestyle changes alone may not be enough to prevent a heart attack or stroke. In these cases, taking a statin at the right dose will most likely be necessary   Fat and Cholesterol Restricted Diet High levels of fat and cholesterol in your blood may lead to various health problems, such as diseases of the heart, blood vessels, gallbladder, liver, and pancreas. Fats are concentrated sources of energy that come in various forms. Certain types of fat, including saturated fat, may be harmful in excess. Cholesterol is a substance needed by your body in small  amounts. Your body makes all the cholesterol it needs. Excess cholesterol comes from the food you eat. When you have high levels of cholesterol and saturated fat in your blood, health problems can develop because the excess fat and cholesterol will gather along the walls of your blood vessels, causing them to narrow. Choosing the right foods will help you control your intake of fat and cholesterol. This will help keep the levels of these substances in your blood within normal limits and reduce your risk of disease. What is my plan? Your health care provider recommends that you:  Limit your fat intake to ______% or less of your total calories per day.  Limit the amount of cholesterol in your diet to less than _________mg per day.  Eat 20-30 grams of fiber each day. What types of fat should I choose?  Choose healthy fats more often. Choose monounsaturated and polyunsaturated fats, such as olive and canola oil, flaxseeds, walnuts, almonds, and seeds.  Eat more omega-3 fats. Good choices include salmon, mackerel, sardines, tuna, flaxseed oil, and ground flaxseeds. Aim to eat fish at least two times a week.  Limit saturated fats. Saturated fats are primarily found in animal products, such as meats, butter, and cream. Plant sources of saturated fats include palm oil, palm kernel oil, and coconut oil.  Avoid foods with partially hydrogenated oils in them. These contain trans fats. Examples of foods that contain trans fats are stick margarine, some tub margarines, cookies, crackers, and other baked goods. What general guidelines do I need to follow? These guidelines for healthy eating will help you control your intake of fat and cholesterol:  Check food labels carefully to identify foods with trans fats or high amounts of saturated fat.  Fill one half of your plate with vegetables and green salads.  Fill one fourth of your plate with whole grains. Look for the word "whole" as the first word in the  ingredient list.  Fill one fourth of your plate with lean protein foods.  Limit fruit to two servings a day. Choose fruit instead of juice.  Eat more foods that contain fiber, such as apples, broccoli, carrots, beans, peas, and barley.  Eat more home-cooked food and less restaurant, buffet, and fast food.  Limit or avoid alcohol.  Limit foods high in starch and sugar.  Limit fried foods.  Cook foods using methods other than frying. Baking, boiling, grilling, and broiling are all great options.  Lose weight if you are overweight. Losing just 5-10% of your initial body weight can help your overall health and prevent diseases such as diabetes and heart disease. What foods can I eat? Grains   Whole grains, such as whole wheat or whole grain breads, crackers, cereals, and pasta. Unsweetened oatmeal, bulgur, barley, quinoa, or brown rice. Corn or whole wheat flour tortillas. Vegetables   Fresh or frozen vegetables (raw, steamed, roasted, or grilled). Green salads. Fruits   All fresh, canned (in natural juice), or frozen fruits. Meats and other protein foods   Ground beef (85% or leaner), grass-fed beef, or beef trimmed of fat. Skinless chicken or [place]. Ground chicken or [place]. Pork trimmed of fat. All fish and seafood. Eggs. Dried beans, peas, or lentils. Unsalted nuts or seeds. Unsalted canned or dry beans. Dairy   Low-fat dairy products, such as skim or 1% milk, 2% or reduced-fat cheeses, low-fat ricotta or cottage cheese, or plain low-fat yo Fats and oils   Tub margarines without trans fats. Light or reduced-fat mayonnaise and salad dressings. Avocado. Olive, canola, sesame, or safflower oils. Natural peanut or almond butter (choose ones without added sugar and oil). The items listed above may not be a complete list of recommended foods or beverages. Contact your dietitian for more options.  Foods to avoid Grains   White bread. White pasta. White rice. Cornbread. Bagels,  pastries, and croissants. Crackers that contain trans fat. Vegetables   White potatoes. Corn. Creamed or fried vegetables. Vegetables in a cheese sauce. Fruits   Dried fruits. Canned fruit in light or heavy syrup. Fruit juice. Meats and other protein foods   Fatty cuts of meat. Ribs, chicken wings, bacon, sausage, bologna, salami, chitterlings, fatback, hot dogs, bratwurst, and packaged luncheon meats. Liver and organ meats. Dairy   Whole or 2% milk, cream, half-and-half, and cream cheese. Whole milk cheeses. Whole-fat or sweetened yogurt. Full-fat cheeses. Nondairy creamers and whipped toppings. Processed cheese, cheese spreads, or cheese curds. Beverages   Alcohol. Sweetened drinks (such as sodas, lemonade, and fruit drinks or punches). Fats and oils   Butter, stick margarine, lard, shortening, ghee, or bacon fat. Coconut, palm kernel, or palm oils. Sweets and desserts   Corn syrup, sugars, honey, and molasses. Candy. Jam and jelly. Syrup. Sweetened cereals. Cookies, pies, cakes, donuts, muffins, and ice cream. The items listed above may not be a complete list of foods and beverages to avoid. Contact your dietitian for more information.  This information is not intended to replace advice given to you by your health care provider. Make sure you discuss any questions you have with your health care provider. Document Released: 12/31/2004 Document Revised: 01/21/2014 Document Reviewed: 03/31/2013 Elsevier Interactive Patient Education  2017 [employer].

## 2016-05-15 NOTE — Progress Notes (Signed)
HPI:                                                                Nathan Stephenson is a 56 y.o. male who presents to [facility]: [place] today for annual physical exam  Current Concerns include erectile dysfunction  Patient reports history of ED. He has tried Viagra and Cialis in the past and states he experienced dizziness. He is wondering if there are any other pharmacologic options.  Health Maintenance Health Maintenance  Topic Date Due  . Hepatitis C Screening  08-27-1960  . TETANUS/TDAP  01/15/2015  . HIV Screening  05/15/2021 (Originally 05/17/1975)  . INFLUENZA VACCINE  08/14/2016  . COLONOSCOPY  12/07/2018    Past Medical History:  Diagnosis Date  . Low back pain   . Personal history of colonic polyps 03/09/2012  . Radicular pain    neck   Past Surgical History:  Procedure Laterality Date  . LIPOMA EXCISION  09/2002  . LUMBAR FUSION     L2-L3  . SPINE SURGERY    . VASECTOMY     Social History  Substance Use Topics  . Smoking status: Never Smoker  . Smokeless tobacco: Not on file  . Alcohol use 0.5 oz/week    1 drink(s) per week   family history includes Aplastic anemia in his brother; Celiac disease in his brother; Diabetes in his brother; Hypertension in his mother; Lung cancer in his mother; Rheum arthritis in his brother.  ROS: negative except as noted in the HPI  Medications: Current Outpatient Prescriptions  Medication Sig Dispense Refill  . sildenafil (VIAGRA) 50 MG tablet Take 0.5 tablets (25 mg total) by mouth daily as needed for erectile dysfunction. 10 tablet 0   No current facility-administered medications for this visit.    No Known Allergies     Objective:  BP 114/80   Pulse (!) 59   Wt 262 lb (118.8 kg)   BMI 34.57 kg/m  Gen: well-groomed, cooperative, not ill-appearing, no distress HEENT: normal conjunctiva, TM's clear, oropharynx clear, moist mucus membranes, no thyromegaly or  tenderness Pulm: Normal work of breathing, normal phonation, clear to auscultation bilaterally CV: Normal rate, regular rhythm, s1 and s2 distinct, no murmurs, clicks or rubs, no carotid bruit GI: abdomen soft, nondistended, nontender, no masses GU: patient deferred Neuro: alert and oriented x 3, EOM's intact, PERRLA, DTR's intact, normal tone, no tremor MSK: moving all extremities, normal gait and station, no peripheral edema Skin: warm and dry, no rashes or lesions on exposed skin Psych: normal affect, euthymic mood, normal speech and thought content   No results found for this or any previous visit (from the past 72 hour(s)). No results found.    Assessment and Plan: 56 y.o. male with   1. Encounter for preventive health examination  - CBC - COMPLETE METABOLIC PANEL WITH GFR - Hepatitis C antibody - Lipid Panel w/reflex Direct LDL - Colonoscopy UTD - declines prostate exam - declines STI/HIV testing  2. Vasculogenic erectile dysfunction, unspecified vasculogenic erectile dysfunction type - recommended patient take 1/2 tablet and encouraged good hydration to avoid orthostasis - sildenafil (VIAGRA) 50 MG tablet; Take 0.5 tablets (25 mg total) by mouth daily as needed for erectile dysfunction.  Dispense:  10 tablet; Refill: 0  3. Need for shingles vaccine - Varicella-zoster vaccine IM  4. Borderline high cholesterol, Obesity (BMI 34) - 30yr ASCVD risk 4.4% - recommended daily baby aspirin to decrease cardiovascular risk - discussed diet and lifestyle changes to lower LDL cholesterol Lab Results  Component Value Date   CHOL 207 (H) 02/07/2015   HDL 61 02/07/2015   LDLCALC 121 02/07/2015   TRIG 123 02/07/2015   CHOLHDL 3.4 02/07/2015    Patient education and anticipatory guidance given Patient agrees with treatment plan Follow-up in 1 year for CPE or sooner as needed  Darlyne Russian PA-C

## 2016-05-16 LAB — HEPATITIS C ANTIBODY: HCV AB: NEGATIVE

## 2016-06-14 DIAGNOSIS — M9902 Segmental and somatic dysfunction of thoracic region: Secondary | ICD-10-CM | POA: Diagnosis not present

## 2016-06-14 DIAGNOSIS — M25551 Pain in right hip: Secondary | ICD-10-CM | POA: Diagnosis not present

## 2016-06-14 DIAGNOSIS — M9903 Segmental and somatic dysfunction of lumbar region: Secondary | ICD-10-CM | POA: Diagnosis not present

## 2016-06-14 DIAGNOSIS — M9905 Segmental and somatic dysfunction of pelvic region: Secondary | ICD-10-CM | POA: Diagnosis not present

## 2016-06-14 DIAGNOSIS — M9904 Segmental and somatic dysfunction of sacral region: Secondary | ICD-10-CM | POA: Diagnosis not present

## 2016-06-14 DIAGNOSIS — Q7649 Other congenital malformations of spine, not associated with scoliosis: Secondary | ICD-10-CM | POA: Diagnosis not present

## 2016-08-20 DIAGNOSIS — Q7649 Other congenital malformations of spine, not associated with scoliosis: Secondary | ICD-10-CM | POA: Diagnosis not present

## 2016-08-29 DIAGNOSIS — M79673 Pain in unspecified foot: Secondary | ICD-10-CM | POA: Diagnosis not present

## 2016-08-29 DIAGNOSIS — R52 Pain, unspecified: Secondary | ICD-10-CM | POA: Diagnosis not present

## 2016-09-12 DIAGNOSIS — S92355B Nondisplaced fracture of fifth metatarsal bone, left foot, initial encounter for open fracture: Secondary | ICD-10-CM | POA: Diagnosis not present

## 2016-09-12 DIAGNOSIS — M79673 Pain in unspecified foot: Secondary | ICD-10-CM | POA: Diagnosis not present

## 2016-10-07 DIAGNOSIS — H6123 Impacted cerumen, bilateral: Secondary | ICD-10-CM | POA: Diagnosis not present

## 2016-10-07 DIAGNOSIS — H60392 Other infective otitis externa, left ear: Secondary | ICD-10-CM | POA: Diagnosis not present

## 2016-12-02 ENCOUNTER — Ambulatory Visit: Payer: BLUE CROSS/BLUE SHIELD | Admitting: Physician Assistant

## 2016-12-02 VITALS — BP 137/91 | HR 66 | Temp 97.7°F | Ht 73.0 in | Wt 258.0 lb

## 2016-12-02 DIAGNOSIS — M278 Other specified diseases of jaws: Secondary | ICD-10-CM

## 2016-12-02 DIAGNOSIS — R220 Localized swelling, mass and lump, head: Secondary | ICD-10-CM | POA: Diagnosis not present

## 2016-12-02 DIAGNOSIS — Z23 Encounter for immunization: Secondary | ICD-10-CM | POA: Diagnosis not present

## 2016-12-02 NOTE — Progress Notes (Signed)
HPI:                                                                Nathan Stephenson is a 56 y.o. 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 Endorses clicking in his jaw and a crooked bite. This has been gradually worsening for a few years. Denies constitutional pain, claudication, or other symptoms.  Past Medical History:  Diagnosis Date  . Low back pain   . Personal history of colonic polyps 03/09/2012  . Radicular pain    neck   Past Surgical History:  Procedure Laterality Date  . LIPOMA EXCISION  09/2002  . LUMBAR FUSION     L2-L3  . SPINE SURGERY    . VASECTOMY     Social History   Tobacco Use  . Smoking status: Never Smoker  . Smokeless tobacco: Never Used  Substance Use Topics  . Alcohol use: Yes    Alcohol/week: 0.5 oz    Types: 1 Standard drinks or equivalent per week   family history includes Aplastic anemia in his brother; Celiac disease in his brother; Diabetes in his brother; Hypertension in his mother; Lung cancer in his mother; Rheum arthritis in his brother.  ROS: negative except as noted in the HPI  Medications: Current Outpatient Medications  Medication Sig Dispense Refill  . sildenafil (VIAGRA) 50 MG tablet Take 0.5 tablets (25 mg total) by mouth daily as needed for erectile dysfunction. 10 tablet 0   No current facility-administered medications for this visit.    No Known Allergies     Objective:  BP (!) 137/91   Pulse 66   Temp 97.7 F (36.5 C) (Oral)   Ht 6\' 1"  (1.854 m)   Wt 258 lb (117 kg)   BMI 34.04 kg/m  Gen:  alert, not ill-appearing, no distress, appropriate for age, obese male HEENT: head normocephalic without obvious abnormality, conjunctiva and cornea clear, trachea midline Pulm: Normal work of breathing, normal  phonation Neuro: alert and oriented x 3, no tremor MSK: extremities atraumatic, normal gait and station Skin: intact, no rashes on exposed skin, no jaundice, no cyanosis  No flowsheet data found.   No results found for this or any previous visit (from the past 72 hour(s)). No results found.    Assessment and Plan: 57 y.o. male with   1. Need for influenza vaccination - Flu Vaccine QUAD 36+ mos IM  2. Mass of jaw - patient will email imaging results at his earliest convenience so that they can be forwarded to ENT - Ambulatory referral to ENT  Patient education and anticipatory guidance given Patient agrees with treatment plan Follow-up as needed if symptoms worsen or fail to improve  Darlyne Russian PA-C

## 2016-12-02 NOTE — Patient Instructions (Signed)
[email or web address] [address] [place], [place] [address] [phone] FAX

## 2016-12-07 ENCOUNTER — Encounter: Payer: Self-pay | Admitting: Physician Assistant

## 2016-12-09 ENCOUNTER — Telehealth: Payer: Self-pay | Admitting: *Deleted

## 2016-12-09 NOTE — Telephone Encounter (Signed)
Left message on patient's voicemail.

## 2016-12-09 NOTE — Telephone Encounter (Signed)
-----   Message from [facility] [place] sent at 12/07/2016  6:51 PM EST ----- Regarding: dental x-ray records Please let Parks know I have still not received an email from him with his x-ray records.

## 2016-12-18 DIAGNOSIS — M2624 Reverse articulation: Secondary | ICD-10-CM | POA: Diagnosis not present

## 2016-12-18 DIAGNOSIS — M2740 Unspecified cyst of jaw: Secondary | ICD-10-CM | POA: Diagnosis not present

## 2017-01-17 DIAGNOSIS — M2740 Unspecified cyst of jaw: Secondary | ICD-10-CM | POA: Diagnosis not present

## 2017-01-17 DIAGNOSIS — M2669 Other specified disorders of temporomandibular joint: Secondary | ICD-10-CM | POA: Diagnosis not present

## 2017-01-22 ENCOUNTER — Ambulatory Visit (INDEPENDENT_AMBULATORY_CARE_PROVIDER_SITE_OTHER): Payer: BLUE CROSS/BLUE SHIELD | Admitting: Family Medicine

## 2017-01-22 ENCOUNTER — Encounter: Payer: Self-pay | Admitting: Family Medicine

## 2017-01-22 VITALS — BP 125/80 | HR 65 | Ht 73.0 in | Wt 259.0 lb

## 2017-01-22 DIAGNOSIS — H60501 Unspecified acute noninfective otitis externa, right ear: Secondary | ICD-10-CM | POA: Diagnosis not present

## 2017-01-22 MED ORDER — NEOMYCIN-POLYMYXIN-HC 3.5-10000-1 OT SOLN: 4.0000 [drp] | Freq: Four times a day (QID) | OTIC | 5 refills | Status: DC

## 2017-01-22 NOTE — Patient Instructions (Signed)
Thank you for coming in today. Use the ear drops 4x daily.  Recheck sooner if needed.  Return as needed.    Otitis Externa Otitis externa is an infection of the outer ear canal. The outer ear canal is the area between the outside of the ear and the eardrum. Otitis externa is sometimes called "swimmer's ear." What are the causes? This condition may be caused by:  Swimming in dirty water.  Moisture in the ear.  An injury to the inside of the ear.  An object stuck in the ear.  A cut or scrape on the outside of the ear.  What increases the risk? This condition is more likely to develop in swimmers. What are the signs or symptoms? The first symptom of this condition is often itching in the ear. Later signs and symptoms include:  Swelling of the ear.  Redness in the ear.  Ear pain. The pain may get worse when you pull on your ear.  Pus coming from the ear.  How is this diagnosed? This condition may be diagnosed by examining the ear and testing fluid from the ear for bacteria and funguses. How is this treated? This condition may be treated with:  Antibiotic ear drops. These are often given for 10-14 days.  Medicine to reduce itching and swelling.  Follow these instructions at home:  If you were prescribed antibiotic ear drops, apply them as told by your health care provider. Do not stop using the antibiotic even if your condition improves.  Take over-the-counter and prescription medicines only as told by your health care provider.  Keep all follow-up visits as told by your health care provider. This is important. How is this prevented?  Keep your ear dry. Use the corner of a towel to dry your ear after you swim or bathe.  Avoid scratching or putting things in your ear. Doing these things can damage the ear canal or remove the protective wax that lines it, which makes it easier for bacteria and funguses to grow.  Avoid swimming in lakes, polluted water, or pools that  may not have the right amount of chlorine.  Consider making ear drops and putting 3 or 4 drops in each ear after you swim. Ask your health care provider about how you can make ear drops. Contact a health care provider if:  You have a fever.  After 3 days your ear is still red, swollen, painful, or draining pus.  Your redness, swelling, or pain gets worse.  You have a severe headache.  You have redness, swelling, pain, or tenderness in the area behind your ear. This information is not intended to replace advice given to you by your health care provider. Make sure you discuss any questions you have with your health care provider. Document Released: 12/31/2004 Document Revised: 02/07/2015 Document Reviewed: 10/10/2014 Elsevier Interactive Patient Education  [employer].

## 2017-01-22 NOTE — Progress Notes (Signed)
Nathan Stephenson is a 57 y.o. male who presents to [facility]: [place] today for right-sided ear pain.  Nathan Stephenson has a several week history of right ear pain and irritation.  He notes a scaly rash on the outside of his ear and pain on the inside associated with decreased hearing.  He denies fevers or chills nausea vomiting or diarrhea.  He has had similar problems like this previously and has been prescribed antibiotic eardrops in the past which has helped some.  He is tried using the antibiotic eardrops (ofloxacin) for this episode which have not helped.   Past Medical History:  Diagnosis Date  . Low back pain   . Personal history of colonic polyps 03/09/2012  . Radicular pain    neck   Past Surgical History:  Procedure Laterality Date  . LIPOMA EXCISION  09/2002  . LUMBAR FUSION     L2-L3  . SPINE SURGERY    . VASECTOMY     Social History   Tobacco Use  . Smoking status: Never Smoker  . Smokeless tobacco: Never Used  Substance Use Topics  . Alcohol use: Yes    Alcohol/week: 0.5 oz    Types: 1 Standard drinks or equivalent per week   family history includes Aplastic anemia in his brother; Celiac disease in his brother; Diabetes in his brother; Hypertension in his mother; Lung cancer in his mother; Rheum arthritis in his brother.  ROS as above:  Medications: Current Outpatient Medications  Medication Sig Dispense Refill  . sildenafil (VIAGRA) 50 MG tablet Take 0.5 tablets (25 mg total) by mouth daily as needed for erectile dysfunction. 10 tablet 0  . neomycin-polymyxin-hydrocortisone (CORTISPORIN) OTIC solution Place 4 drops into the right ear 4 (four) times daily. 10 mL 5   No current facility-administered medications for this visit.    No Known Allergies  Health Maintenance Health Maintenance  Topic Date Due  . TETANUS/TDAP  01/15/2015  . HIV Screening   05/15/2021 (Originally 05/17/1975)  . COLONOSCOPY  12/07/2018  . INFLUENZA VACCINE  Completed  . Hepatitis C Screening  Completed     Exam:  BP 125/80   Pulse 65   Ht 6\' 1"  (1.854 m)   Wt 259 lb (117.5 kg)   BMI 34.17 kg/m  Gen: Well NAD HEENT: EOMI,  MMM right ear canal is scaly and erythematous.  The tympanic membrane is erythematous but no effusion or bulging visualized.  The left tympanic membrane is also mildly erythematous with a mildly erythematous ear canal. Of note cerumen was irrigated prior to this exam. Lungs: Normal work of breathing. CTABL Heart: RRR no MRG Abd: NABS, Soft. Nondistended, Nontender Exts: Brisk capillary refill, warm and well perfused.    No results found for this or any previous visit (from the past 72 hour(s)). No results found.    Assessment and Plan: 57 y.o. male with otitis externa.  Unclear etiology.  I am concerned he may have psoriasis or some other rash type condition causing otitis externa.  Will treat with Cortisporin drops.  If not better refer to ENT.   No orders of the defined types were placed in this encounter.  Meds ordered this encounter  Medications  . neomycin-polymyxin-hydrocortisone (CORTISPORIN) OTIC solution    Sig: Place 4 drops into the right ear 4 (four) times daily.    Dispense:  10 mL    Refill:  5     Discussed warning signs  or symptoms. Please see discharge instructions. Patient expresses understanding.

## 2017-07-31 DIAGNOSIS — R52 Pain, unspecified: Secondary | ICD-10-CM | POA: Diagnosis not present

## 2017-07-31 DIAGNOSIS — M65342 Trigger finger, left ring finger: Secondary | ICD-10-CM | POA: Diagnosis not present

## 2018-08-20 DIAGNOSIS — M65342 Trigger finger, left ring finger: Secondary | ICD-10-CM | POA: Diagnosis not present

## 2018-09-07 DIAGNOSIS — Z01812 Encounter for preprocedural laboratory examination: Secondary | ICD-10-CM | POA: Diagnosis not present

## 2018-09-07 DIAGNOSIS — Z20828 Contact with and (suspected) exposure to other viral communicable diseases: Secondary | ICD-10-CM | POA: Diagnosis not present

## 2018-09-07 DIAGNOSIS — M65342 Trigger finger, left ring finger: Secondary | ICD-10-CM | POA: Diagnosis not present

## 2018-09-11 DIAGNOSIS — M65342 Trigger finger, left ring finger: Secondary | ICD-10-CM | POA: Diagnosis not present

## 2018-09-11 DIAGNOSIS — Z79899 Other long term (current) drug therapy: Secondary | ICD-10-CM | POA: Diagnosis not present

## 2018-09-11 DIAGNOSIS — E785 Hyperlipidemia, unspecified: Secondary | ICD-10-CM | POA: Diagnosis not present

## 2018-09-16 DIAGNOSIS — M1711 Unilateral primary osteoarthritis, right knee: Secondary | ICD-10-CM | POA: Diagnosis not present

## 2018-09-16 DIAGNOSIS — R52 Pain, unspecified: Secondary | ICD-10-CM | POA: Diagnosis not present

## 2018-09-16 DIAGNOSIS — M65342 Trigger finger, left ring finger: Secondary | ICD-10-CM | POA: Diagnosis not present

## 2018-10-14 DIAGNOSIS — M7542 Impingement syndrome of left shoulder: Secondary | ICD-10-CM | POA: Diagnosis not present

## 2018-10-14 DIAGNOSIS — M65342 Trigger finger, left ring finger: Secondary | ICD-10-CM | POA: Diagnosis not present

## 2019-01-22 DIAGNOSIS — M1711 Unilateral primary osteoarthritis, right knee: Secondary | ICD-10-CM | POA: Diagnosis not present

## 2019-03-24 DIAGNOSIS — M1711 Unilateral primary osteoarthritis, right knee: Secondary | ICD-10-CM | POA: Diagnosis not present

## 2019-07-14 DIAGNOSIS — M1711 Unilateral primary osteoarthritis, right knee: Secondary | ICD-10-CM | POA: Diagnosis not present

## 2019-08-25 DIAGNOSIS — M1711 Unilateral primary osteoarthritis, right knee: Secondary | ICD-10-CM | POA: Diagnosis not present

## 2019-08-27 DIAGNOSIS — C021 Malignant neoplasm of border of tongue: Secondary | ICD-10-CM | POA: Diagnosis not present

## 2019-09-02 ENCOUNTER — Other Ambulatory Visit: Payer: Self-pay

## 2019-09-02 ENCOUNTER — Ambulatory Visit (INDEPENDENT_AMBULATORY_CARE_PROVIDER_SITE_OTHER): Payer: BC Managed Care – PPO | Admitting: Family Medicine

## 2019-09-02 ENCOUNTER — Encounter: Payer: Self-pay | Admitting: Family Medicine

## 2019-09-02 VITALS — BP 113/53 | HR 47 | Temp 97.8°F | Ht 72.84 in | Wt 232.0 lb

## 2019-09-02 DIAGNOSIS — Z125 Encounter for screening for malignant neoplasm of prostate: Secondary | ICD-10-CM

## 2019-09-02 DIAGNOSIS — R001 Bradycardia, unspecified: Secondary | ICD-10-CM | POA: Diagnosis not present

## 2019-09-02 DIAGNOSIS — E789 Disorder of lipoprotein metabolism, unspecified: Secondary | ICD-10-CM

## 2019-09-02 DIAGNOSIS — E559 Vitamin D deficiency, unspecified: Secondary | ICD-10-CM

## 2019-09-02 DIAGNOSIS — K1370 Unspecified lesions of oral mucosa: Secondary | ICD-10-CM

## 2019-09-02 DIAGNOSIS — R42 Dizziness and giddiness: Secondary | ICD-10-CM | POA: Diagnosis not present

## 2019-09-02 DIAGNOSIS — C021 Malignant neoplasm of border of tongue: Secondary | ICD-10-CM | POA: Diagnosis not present

## 2019-09-02 LAB — COMPLETE METABOLIC PANEL WITH GFR
AG Ratio: 2.3 (calc) (ref 1.0–2.5)
ALT: 15 U/L (ref 9–46)
AST: 18 U/L (ref 10–35)
Albumin: 4.3 g/dL (ref 3.6–5.1)
Alkaline phosphatase (APISO): 34 U/L — ABNORMAL LOW (ref 35–144)
BUN: 15 mg/dL (ref 7–25)
CO2: 29 mmol/L (ref 20–32)
Calcium: 9.3 mg/dL (ref 8.6–10.3)
Chloride: 106 mmol/L (ref 98–110)
Creat: 0.85 mg/dL (ref 0.70–1.25)
GFR, Est African American: 110 mL/min/{1.73_m2} (ref >=60)
GFR, Est Non African American: 95 mL/min/{1.73_m2} (ref >=60)
Globulin: 1.9 g/dL (calc) (ref 1.9–3.7)
Glucose, Bld: 93 mg/dL (ref 65–99)
Potassium: 4.5 mmol/L (ref 3.5–5.3)
Sodium: 139 mmol/L (ref 135–146)
Total Bilirubin: 1.3 mg/dL — ABNORMAL HIGH (ref 0.2–1.2)
Total Protein: 6.2 g/dL (ref 6.1–8.1)

## 2019-09-02 LAB — TSH: TSH: 1.85 mIU/L (ref 0.40–4.50)

## 2019-09-02 LAB — CBC
HCT: 49.2 % (ref 38.5–50.0)
Hemoglobin: 16.8 g/dL (ref 13.2–17.1)
MCH: 31.1 pg (ref 27.0–33.0)
MCHC: 34.1 g/dL (ref 32.0–36.0)
MCV: 90.9 fL (ref 80.0–100.0)
MPV: 11.7 fL (ref 7.5–12.5)
Platelets: 162 10*3/uL (ref 140–400)
RBC: 5.41 10*6/uL (ref 4.20–5.80)
RDW: 12.3 % (ref 11.0–15.0)
WBC: 4.1 10*3/uL (ref 3.8–10.8)

## 2019-09-02 LAB — LIPID PANEL
Cholesterol: 186 mg/dL (ref <200)
HDL: 52 mg/dL (ref >=40)
LDL Cholesterol (Calc): 117 mg/dL (calc) — ABNORMAL HIGH
Non-HDL Cholesterol (Calc): 134 mg/dL (calc) — ABNORMAL HIGH (ref <130)
Total CHOL/HDL Ratio: 3.6 (calc) (ref <5.0)
Triglycerides: 78 mg/dL (ref <150)

## 2019-09-02 LAB — PSA: PSA: 0.6 ng/mL (ref <=4.0)

## 2019-09-02 LAB — VITAMIN D 25 HYDROXY (VIT D DEFICIENCY, FRACTURES): Vit D, 25-Hydroxy: 26 ng/mL — ABNORMAL LOW (ref 30–100)

## 2019-09-02 NOTE — Assessment & Plan Note (Signed)
More vertiginous in nature.  He can try OTC meclizine as needed.   If symptoms persist we may need to obtain imaging.

## 2019-09-02 NOTE — Assessment & Plan Note (Signed)
EKG with bradycardia, rate 42 BPM.  No other ischemic features noted.  Checking TSH and electrolytes, if normal will refer to cardiology.

## 2019-09-02 NOTE — Assessment & Plan Note (Signed)
Recent biopsy, pending results.

## 2019-09-02 NOTE — Progress Notes (Signed)
Nathan Stephenson - 59 y.o. male MRN [id number]  Date of birth: May 30, 1960  Subjective Chief Complaint  Patient presents with  . Establish Care    HPI Nathan Stephenson is a 59 y.o. male here today for initial visit.  He is concerned about lesion on tongue.  He first noticed a few weeks ago, area was sore and non-healing.  He saw his dentist who referred him to an oral surgeon.  He had biopsy last week, results are pending.  Area has healed well after biopsy.  He is very anxious about the results.  He did also have an enlarged lymph node in his neck.  Oral surgeon started him on amoxicillin and he has had improvement of this.    He also reports some intermittent feeling of dizziness.  Sensation is feeling like room is spinning around him rather than presyncopal.  He denies palpitations, nausea, or vision changes.    ROS:  A comprehensive ROS was completed and negative except as noted per HPI  No Known Allergies  Past Medical History:  Diagnosis Date  . Low back pain   . Personal history of colonic polyps 03/09/2012  . Radicular pain    neck    Past Surgical History:  Procedure Laterality Date  . LIPOMA EXCISION  09/2002  . LUMBAR FUSION     L2-L3  . SPINE SURGERY    . VASECTOMY      Social History   Socioeconomic History  . Marital status: Married    Spouse name: Not on file  . Number of children: Not on file  . Years of education: Not on file  . Highest education level: Not on file  Occupational History  . Not on file  Tobacco Use  . Smoking status: Former Research scientist (life sciences)  . Smokeless tobacco: Never Used  Vaping Use  . Vaping Use: Never used  Substance and Sexual Activity  . Alcohol use: Yes    Alcohol/week: 2.0 - 3.0 standard drinks    Types: 2 - 3 Standard drinks or equivalent per week  . Drug use: No  . Sexual activity: Yes    Birth control/protection: None    Comment: wife  Other Topics Concern  . Not on file  Social History Narrative  . Not on file   Social  Determinants of Health   Financial Resource Strain:   . Difficulty of Paying Living Expenses: Not on file  Food Insecurity:   . Worried About Charity fundraiser in the Last Year: Not on file  . Ran Out of Food in the Last Year: Not on file  Transportation Needs:   . Lack of Transportation (Medical): Not on file  . Lack of Transportation (Non-Medical): Not on file  Physical Activity:   . Days of Exercise per Week: Not on file  . Minutes of Exercise per Session: Not on file  Stress:   . Feeling of Stress : Not on file  Social Connections:   . Frequency of Communication with Friends and Family: Not on file  . Frequency of Social Gatherings with Friends and Family: Not on file  . Attends Religious Services: Not on file  . Active Member of Clubs or Organizations: Not on file  . Attends Archivist Meetings: Not on file  . Marital Status: Not on file    Family History  Problem Relation Age of Onset  . Hypertension Mother   . Lung cancer Mother   . Diabetes Brother   .  Aplastic anemia Brother   . Celiac disease Brother   . Rheum arthritis Brother     Health Maintenance  Topic Date Due  . TETANUS/TDAP  01/15/2015  . COLONOSCOPY  12/07/2018  . INFLUENZA VACCINE  08/15/2019  . HIV Screening  05/15/2021 (Originally 05/17/1974)  . COVID-19 Vaccine  Completed  . Hepatitis C Screening  Completed     ----------------------------------------------------------------------------------------------------------------------------------------------------------------------------------------------------------------- Physical Exam BP (!) 113/53 (BP Location: Left Arm, Patient Position: Sitting, Cuff Size: Large)   Pulse (!) 47   Temp 97.8 F (36.6 C) (Temporal)   Ht 6' 0.84" (1.85 m)   Wt 232 lb (105.2 kg)   SpO2 99%   BMI 30.75 kg/m   Physical Exam Constitutional:      Appearance: Normal appearance.  HENT:     Head: Normocephalic and atraumatic.  Eyes:     General: No  scleral icterus. Cardiovascular:     Rate and Rhythm: Regular rhythm. Bradycardia present.  Pulmonary:     Effort: Pulmonary effort is normal.     Breath sounds: Normal breath sounds.  Musculoskeletal:     Cervical back: Neck supple.  Neurological:     General: No focal deficit present.     Mental Status: He is alert.  Psychiatric:        Mood and Affect: Mood normal.        Behavior: Behavior normal.     ------------------------------------------------------------------------------------------------------------------------------------------------------------------------------------------------------------------- Assessment and Plan  Dizziness More vertiginous in nature.  He can try OTC meclizine as needed.   If symptoms persist we may need to obtain imaging.  Bradycardia EKG with bradycardia, rate 42 BPM.  No other ischemic features noted.  Checking TSH and electrolytes, if normal will refer to cardiology.   Oral lesion Recent biopsy, pending results.     No orders of the defined types were placed in this encounter.   No follow-ups on file.    This visit occurred during the SARS-CoV-2 public health emergency.  Safety protocols were in place, including screening questions prior to the visit, additional usage of staff PPE, and extensive cleaning of exam room while observing appropriate contact time as indicated for disinfecting solutions.

## 2019-09-02 NOTE — Patient Instructions (Addendum)
Your heart rate is low today. EKG doesn't show any obvious abnormalities other than slow heart rate.  If labs are normal I would like for you to see a cardiologist.  Have labs completed.  We'll be in touch with results and recommendations.

## 2019-09-03 ENCOUNTER — Other Ambulatory Visit: Payer: Self-pay | Admitting: Family Medicine

## 2019-09-03 DIAGNOSIS — R001 Bradycardia, unspecified: Secondary | ICD-10-CM

## 2019-09-03 DIAGNOSIS — R0602 Shortness of breath: Secondary | ICD-10-CM | POA: Diagnosis not present

## 2019-09-07 DIAGNOSIS — C029 Malignant neoplasm of tongue, unspecified: Secondary | ICD-10-CM | POA: Diagnosis not present

## 2019-09-08 NOTE — Addendum Note (Signed)
Addended by: Narda Rutherford on: 09/08/2019 08:38 AM   Modules accepted: Orders

## 2019-09-15 DIAGNOSIS — I700 Atherosclerosis of aorta: Secondary | ICD-10-CM | POA: Diagnosis not present

## 2019-09-15 DIAGNOSIS — C029 Malignant neoplasm of tongue, unspecified: Secondary | ICD-10-CM | POA: Diagnosis not present

## 2019-09-15 DIAGNOSIS — I2510 Atherosclerotic heart disease of native coronary artery without angina pectoris: Secondary | ICD-10-CM | POA: Diagnosis not present

## 2019-09-15 DIAGNOSIS — J439 Emphysema, unspecified: Secondary | ICD-10-CM | POA: Diagnosis not present

## 2019-09-24 DIAGNOSIS — C021 Malignant neoplasm of border of tongue: Secondary | ICD-10-CM | POA: Diagnosis not present

## 2019-09-24 DIAGNOSIS — C029 Malignant neoplasm of tongue, unspecified: Secondary | ICD-10-CM | POA: Diagnosis not present

## 2019-10-08 DIAGNOSIS — C023 Malignant neoplasm of anterior two-thirds of tongue, part unspecified: Secondary | ICD-10-CM | POA: Diagnosis not present

## 2019-10-08 DIAGNOSIS — C029 Malignant neoplasm of tongue, unspecified: Secondary | ICD-10-CM | POA: Diagnosis not present

## 2019-10-18 DIAGNOSIS — C023 Malignant neoplasm of anterior two-thirds of tongue, part unspecified: Secondary | ICD-10-CM | POA: Diagnosis not present

## 2019-10-18 DIAGNOSIS — C020 Malignant neoplasm of dorsal surface of tongue: Secondary | ICD-10-CM | POA: Diagnosis not present

## 2019-10-18 DIAGNOSIS — Z510 Encounter for antineoplastic radiation therapy: Secondary | ICD-10-CM | POA: Diagnosis not present

## 2019-11-03 DIAGNOSIS — Z510 Encounter for antineoplastic radiation therapy: Secondary | ICD-10-CM | POA: Diagnosis not present

## 2019-11-03 DIAGNOSIS — C023 Malignant neoplasm of anterior two-thirds of tongue, part unspecified: Secondary | ICD-10-CM | POA: Diagnosis not present

## 2019-11-03 DIAGNOSIS — C020 Malignant neoplasm of dorsal surface of tongue: Secondary | ICD-10-CM | POA: Diagnosis not present

## 2019-11-08 DIAGNOSIS — Z510 Encounter for antineoplastic radiation therapy: Secondary | ICD-10-CM | POA: Diagnosis not present

## 2019-11-08 DIAGNOSIS — C023 Malignant neoplasm of anterior two-thirds of tongue, part unspecified: Secondary | ICD-10-CM | POA: Diagnosis not present

## 2019-11-08 DIAGNOSIS — C020 Malignant neoplasm of dorsal surface of tongue: Secondary | ICD-10-CM | POA: Diagnosis not present

## 2019-11-09 DIAGNOSIS — Z510 Encounter for antineoplastic radiation therapy: Secondary | ICD-10-CM | POA: Diagnosis not present

## 2019-11-09 DIAGNOSIS — C020 Malignant neoplasm of dorsal surface of tongue: Secondary | ICD-10-CM | POA: Diagnosis not present

## 2019-11-09 DIAGNOSIS — C023 Malignant neoplasm of anterior two-thirds of tongue, part unspecified: Secondary | ICD-10-CM | POA: Diagnosis not present

## 2019-11-10 DIAGNOSIS — C020 Malignant neoplasm of dorsal surface of tongue: Secondary | ICD-10-CM | POA: Diagnosis not present

## 2019-11-10 DIAGNOSIS — C023 Malignant neoplasm of anterior two-thirds of tongue, part unspecified: Secondary | ICD-10-CM | POA: Diagnosis not present

## 2019-11-10 DIAGNOSIS — Z510 Encounter for antineoplastic radiation therapy: Secondary | ICD-10-CM | POA: Diagnosis not present

## 2019-11-11 DIAGNOSIS — C023 Malignant neoplasm of anterior two-thirds of tongue, part unspecified: Secondary | ICD-10-CM | POA: Diagnosis not present

## 2019-11-11 DIAGNOSIS — Z510 Encounter for antineoplastic radiation therapy: Secondary | ICD-10-CM | POA: Diagnosis not present

## 2019-11-11 DIAGNOSIS — C020 Malignant neoplasm of dorsal surface of tongue: Secondary | ICD-10-CM | POA: Diagnosis not present

## 2019-11-12 DIAGNOSIS — C023 Malignant neoplasm of anterior two-thirds of tongue, part unspecified: Secondary | ICD-10-CM | POA: Diagnosis not present

## 2019-11-12 DIAGNOSIS — C020 Malignant neoplasm of dorsal surface of tongue: Secondary | ICD-10-CM | POA: Diagnosis not present

## 2019-11-12 DIAGNOSIS — Z510 Encounter for antineoplastic radiation therapy: Secondary | ICD-10-CM | POA: Diagnosis not present

## 2019-11-15 DIAGNOSIS — C020 Malignant neoplasm of dorsal surface of tongue: Secondary | ICD-10-CM | POA: Diagnosis not present

## 2019-11-15 DIAGNOSIS — C023 Malignant neoplasm of anterior two-thirds of tongue, part unspecified: Secondary | ICD-10-CM | POA: Diagnosis not present

## 2019-11-15 DIAGNOSIS — Z510 Encounter for antineoplastic radiation therapy: Secondary | ICD-10-CM | POA: Diagnosis not present

## 2019-11-16 DIAGNOSIS — C023 Malignant neoplasm of anterior two-thirds of tongue, part unspecified: Secondary | ICD-10-CM | POA: Diagnosis not present

## 2019-11-16 DIAGNOSIS — Z510 Encounter for antineoplastic radiation therapy: Secondary | ICD-10-CM | POA: Diagnosis not present

## 2019-11-16 DIAGNOSIS — C020 Malignant neoplasm of dorsal surface of tongue: Secondary | ICD-10-CM | POA: Diagnosis not present

## 2019-11-17 DIAGNOSIS — C020 Malignant neoplasm of dorsal surface of tongue: Secondary | ICD-10-CM | POA: Diagnosis not present

## 2019-11-17 DIAGNOSIS — C023 Malignant neoplasm of anterior two-thirds of tongue, part unspecified: Secondary | ICD-10-CM | POA: Diagnosis not present

## 2019-11-17 DIAGNOSIS — Z510 Encounter for antineoplastic radiation therapy: Secondary | ICD-10-CM | POA: Diagnosis not present

## 2019-11-18 DIAGNOSIS — C023 Malignant neoplasm of anterior two-thirds of tongue, part unspecified: Secondary | ICD-10-CM | POA: Diagnosis not present

## 2019-11-18 DIAGNOSIS — C020 Malignant neoplasm of dorsal surface of tongue: Secondary | ICD-10-CM | POA: Diagnosis not present

## 2019-11-18 DIAGNOSIS — Z510 Encounter for antineoplastic radiation therapy: Secondary | ICD-10-CM | POA: Diagnosis not present

## 2019-11-19 DIAGNOSIS — Z510 Encounter for antineoplastic radiation therapy: Secondary | ICD-10-CM | POA: Diagnosis not present

## 2019-11-19 DIAGNOSIS — C020 Malignant neoplasm of dorsal surface of tongue: Secondary | ICD-10-CM | POA: Diagnosis not present

## 2019-11-19 DIAGNOSIS — C023 Malignant neoplasm of anterior two-thirds of tongue, part unspecified: Secondary | ICD-10-CM | POA: Diagnosis not present

## 2019-11-22 DIAGNOSIS — Z510 Encounter for antineoplastic radiation therapy: Secondary | ICD-10-CM | POA: Diagnosis not present

## 2019-11-22 DIAGNOSIS — C020 Malignant neoplasm of dorsal surface of tongue: Secondary | ICD-10-CM | POA: Diagnosis not present

## 2019-11-22 DIAGNOSIS — C023 Malignant neoplasm of anterior two-thirds of tongue, part unspecified: Secondary | ICD-10-CM | POA: Diagnosis not present

## 2019-11-23 DIAGNOSIS — C023 Malignant neoplasm of anterior two-thirds of tongue, part unspecified: Secondary | ICD-10-CM | POA: Diagnosis not present

## 2019-11-23 DIAGNOSIS — Z510 Encounter for antineoplastic radiation therapy: Secondary | ICD-10-CM | POA: Diagnosis not present

## 2019-11-23 DIAGNOSIS — C020 Malignant neoplasm of dorsal surface of tongue: Secondary | ICD-10-CM | POA: Diagnosis not present

## 2019-11-24 DIAGNOSIS — C023 Malignant neoplasm of anterior two-thirds of tongue, part unspecified: Secondary | ICD-10-CM | POA: Diagnosis not present

## 2019-11-24 DIAGNOSIS — C020 Malignant neoplasm of dorsal surface of tongue: Secondary | ICD-10-CM | POA: Diagnosis not present

## 2019-11-24 DIAGNOSIS — Z510 Encounter for antineoplastic radiation therapy: Secondary | ICD-10-CM | POA: Diagnosis not present

## 2019-11-25 DIAGNOSIS — Z510 Encounter for antineoplastic radiation therapy: Secondary | ICD-10-CM | POA: Diagnosis not present

## 2019-11-25 DIAGNOSIS — C023 Malignant neoplasm of anterior two-thirds of tongue, part unspecified: Secondary | ICD-10-CM | POA: Diagnosis not present

## 2019-11-25 DIAGNOSIS — C020 Malignant neoplasm of dorsal surface of tongue: Secondary | ICD-10-CM | POA: Diagnosis not present

## 2019-11-26 DIAGNOSIS — C023 Malignant neoplasm of anterior two-thirds of tongue, part unspecified: Secondary | ICD-10-CM | POA: Diagnosis not present

## 2019-11-26 DIAGNOSIS — Z510 Encounter for antineoplastic radiation therapy: Secondary | ICD-10-CM | POA: Diagnosis not present

## 2019-11-26 DIAGNOSIS — C020 Malignant neoplasm of dorsal surface of tongue: Secondary | ICD-10-CM | POA: Diagnosis not present

## 2019-11-29 DIAGNOSIS — Z510 Encounter for antineoplastic radiation therapy: Secondary | ICD-10-CM | POA: Diagnosis not present

## 2019-11-29 DIAGNOSIS — C020 Malignant neoplasm of dorsal surface of tongue: Secondary | ICD-10-CM | POA: Diagnosis not present

## 2019-11-29 DIAGNOSIS — C023 Malignant neoplasm of anterior two-thirds of tongue, part unspecified: Secondary | ICD-10-CM | POA: Diagnosis not present

## 2019-11-30 DIAGNOSIS — Z510 Encounter for antineoplastic radiation therapy: Secondary | ICD-10-CM | POA: Diagnosis not present

## 2019-11-30 DIAGNOSIS — C020 Malignant neoplasm of dorsal surface of tongue: Secondary | ICD-10-CM | POA: Diagnosis not present

## 2019-11-30 DIAGNOSIS — C023 Malignant neoplasm of anterior two-thirds of tongue, part unspecified: Secondary | ICD-10-CM | POA: Diagnosis not present

## 2019-12-01 DIAGNOSIS — C023 Malignant neoplasm of anterior two-thirds of tongue, part unspecified: Secondary | ICD-10-CM | POA: Diagnosis not present

## 2019-12-01 DIAGNOSIS — Z510 Encounter for antineoplastic radiation therapy: Secondary | ICD-10-CM | POA: Diagnosis not present

## 2019-12-01 DIAGNOSIS — C020 Malignant neoplasm of dorsal surface of tongue: Secondary | ICD-10-CM | POA: Diagnosis not present

## 2019-12-02 DIAGNOSIS — C020 Malignant neoplasm of dorsal surface of tongue: Secondary | ICD-10-CM | POA: Diagnosis not present

## 2019-12-02 DIAGNOSIS — Z510 Encounter for antineoplastic radiation therapy: Secondary | ICD-10-CM | POA: Diagnosis not present

## 2019-12-02 DIAGNOSIS — C023 Malignant neoplasm of anterior two-thirds of tongue, part unspecified: Secondary | ICD-10-CM | POA: Diagnosis not present

## 2019-12-03 DIAGNOSIS — C020 Malignant neoplasm of dorsal surface of tongue: Secondary | ICD-10-CM | POA: Diagnosis not present

## 2019-12-03 DIAGNOSIS — C023 Malignant neoplasm of anterior two-thirds of tongue, part unspecified: Secondary | ICD-10-CM | POA: Diagnosis not present

## 2019-12-03 DIAGNOSIS — Z510 Encounter for antineoplastic radiation therapy: Secondary | ICD-10-CM | POA: Diagnosis not present

## 2019-12-06 DIAGNOSIS — Z510 Encounter for antineoplastic radiation therapy: Secondary | ICD-10-CM | POA: Diagnosis not present

## 2019-12-06 DIAGNOSIS — C023 Malignant neoplasm of anterior two-thirds of tongue, part unspecified: Secondary | ICD-10-CM | POA: Diagnosis not present

## 2019-12-06 DIAGNOSIS — C020 Malignant neoplasm of dorsal surface of tongue: Secondary | ICD-10-CM | POA: Diagnosis not present

## 2019-12-07 DIAGNOSIS — C023 Malignant neoplasm of anterior two-thirds of tongue, part unspecified: Secondary | ICD-10-CM | POA: Diagnosis not present

## 2019-12-07 DIAGNOSIS — Z510 Encounter for antineoplastic radiation therapy: Secondary | ICD-10-CM | POA: Diagnosis not present

## 2019-12-07 DIAGNOSIS — C020 Malignant neoplasm of dorsal surface of tongue: Secondary | ICD-10-CM | POA: Diagnosis not present

## 2019-12-08 DIAGNOSIS — C020 Malignant neoplasm of dorsal surface of tongue: Secondary | ICD-10-CM | POA: Diagnosis not present

## 2019-12-08 DIAGNOSIS — Z510 Encounter for antineoplastic radiation therapy: Secondary | ICD-10-CM | POA: Diagnosis not present

## 2019-12-08 DIAGNOSIS — M1711 Unilateral primary osteoarthritis, right knee: Secondary | ICD-10-CM | POA: Diagnosis not present

## 2019-12-08 DIAGNOSIS — C023 Malignant neoplasm of anterior two-thirds of tongue, part unspecified: Secondary | ICD-10-CM | POA: Diagnosis not present

## 2019-12-10 DIAGNOSIS — Z510 Encounter for antineoplastic radiation therapy: Secondary | ICD-10-CM | POA: Diagnosis not present

## 2019-12-10 DIAGNOSIS — C023 Malignant neoplasm of anterior two-thirds of tongue, part unspecified: Secondary | ICD-10-CM | POA: Diagnosis not present

## 2019-12-10 DIAGNOSIS — C020 Malignant neoplasm of dorsal surface of tongue: Secondary | ICD-10-CM | POA: Diagnosis not present

## 2019-12-13 DIAGNOSIS — C023 Malignant neoplasm of anterior two-thirds of tongue, part unspecified: Secondary | ICD-10-CM | POA: Diagnosis not present

## 2019-12-13 DIAGNOSIS — C020 Malignant neoplasm of dorsal surface of tongue: Secondary | ICD-10-CM | POA: Diagnosis not present

## 2019-12-13 DIAGNOSIS — Z510 Encounter for antineoplastic radiation therapy: Secondary | ICD-10-CM | POA: Diagnosis not present

## 2019-12-14 DIAGNOSIS — Z510 Encounter for antineoplastic radiation therapy: Secondary | ICD-10-CM | POA: Diagnosis not present

## 2019-12-14 DIAGNOSIS — C020 Malignant neoplasm of dorsal surface of tongue: Secondary | ICD-10-CM | POA: Diagnosis not present

## 2019-12-14 DIAGNOSIS — E860 Dehydration: Secondary | ICD-10-CM | POA: Diagnosis not present

## 2019-12-14 DIAGNOSIS — C023 Malignant neoplasm of anterior two-thirds of tongue, part unspecified: Secondary | ICD-10-CM | POA: Diagnosis not present

## 2019-12-15 DIAGNOSIS — C023 Malignant neoplasm of anterior two-thirds of tongue, part unspecified: Secondary | ICD-10-CM | POA: Diagnosis not present

## 2019-12-15 DIAGNOSIS — Z510 Encounter for antineoplastic radiation therapy: Secondary | ICD-10-CM | POA: Diagnosis not present

## 2019-12-15 DIAGNOSIS — C020 Malignant neoplasm of dorsal surface of tongue: Secondary | ICD-10-CM | POA: Diagnosis not present

## 2019-12-16 DIAGNOSIS — C023 Malignant neoplasm of anterior two-thirds of tongue, part unspecified: Secondary | ICD-10-CM | POA: Diagnosis not present

## 2019-12-16 DIAGNOSIS — C020 Malignant neoplasm of dorsal surface of tongue: Secondary | ICD-10-CM | POA: Diagnosis not present

## 2019-12-16 DIAGNOSIS — Z510 Encounter for antineoplastic radiation therapy: Secondary | ICD-10-CM | POA: Diagnosis not present

## 2019-12-17 DIAGNOSIS — Z510 Encounter for antineoplastic radiation therapy: Secondary | ICD-10-CM | POA: Diagnosis not present

## 2019-12-17 DIAGNOSIS — C020 Malignant neoplasm of dorsal surface of tongue: Secondary | ICD-10-CM | POA: Diagnosis not present

## 2019-12-17 DIAGNOSIS — C023 Malignant neoplasm of anterior two-thirds of tongue, part unspecified: Secondary | ICD-10-CM | POA: Diagnosis not present

## 2019-12-20 DIAGNOSIS — Z510 Encounter for antineoplastic radiation therapy: Secondary | ICD-10-CM | POA: Diagnosis not present

## 2019-12-20 DIAGNOSIS — C023 Malignant neoplasm of anterior two-thirds of tongue, part unspecified: Secondary | ICD-10-CM | POA: Diagnosis not present

## 2019-12-20 DIAGNOSIS — C020 Malignant neoplasm of dorsal surface of tongue: Secondary | ICD-10-CM | POA: Diagnosis not present

## 2019-12-21 DIAGNOSIS — Z510 Encounter for antineoplastic radiation therapy: Secondary | ICD-10-CM | POA: Diagnosis not present

## 2019-12-21 DIAGNOSIS — C020 Malignant neoplasm of dorsal surface of tongue: Secondary | ICD-10-CM | POA: Diagnosis not present

## 2019-12-21 DIAGNOSIS — C023 Malignant neoplasm of anterior two-thirds of tongue, part unspecified: Secondary | ICD-10-CM | POA: Diagnosis not present

## 2019-12-22 DIAGNOSIS — C023 Malignant neoplasm of anterior two-thirds of tongue, part unspecified: Secondary | ICD-10-CM | POA: Diagnosis not present

## 2019-12-22 DIAGNOSIS — Z510 Encounter for antineoplastic radiation therapy: Secondary | ICD-10-CM | POA: Diagnosis not present

## 2019-12-22 DIAGNOSIS — C020 Malignant neoplasm of dorsal surface of tongue: Secondary | ICD-10-CM | POA: Diagnosis not present

## 2019-12-23 DIAGNOSIS — E860 Dehydration: Secondary | ICD-10-CM | POA: Diagnosis not present

## 2019-12-23 DIAGNOSIS — C023 Malignant neoplasm of anterior two-thirds of tongue, part unspecified: Secondary | ICD-10-CM | POA: Diagnosis not present

## 2019-12-23 DIAGNOSIS — Z510 Encounter for antineoplastic radiation therapy: Secondary | ICD-10-CM | POA: Diagnosis not present

## 2019-12-23 DIAGNOSIS — R112 Nausea with vomiting, unspecified: Secondary | ICD-10-CM | POA: Diagnosis not present

## 2019-12-23 DIAGNOSIS — C020 Malignant neoplasm of dorsal surface of tongue: Secondary | ICD-10-CM | POA: Diagnosis not present

## 2019-12-28 DIAGNOSIS — E860 Dehydration: Secondary | ICD-10-CM | POA: Diagnosis not present

## 2020-01-04 DIAGNOSIS — C023 Malignant neoplasm of anterior two-thirds of tongue, part unspecified: Secondary | ICD-10-CM | POA: Diagnosis not present

## 2020-01-04 DIAGNOSIS — E860 Dehydration: Secondary | ICD-10-CM | POA: Diagnosis not present

## 2020-01-04 DIAGNOSIS — R112 Nausea with vomiting, unspecified: Secondary | ICD-10-CM | POA: Diagnosis not present

## 2020-01-19 DIAGNOSIS — Q7649 Other congenital malformations of spine, not associated with scoliosis: Secondary | ICD-10-CM | POA: Diagnosis not present

## 2020-01-19 DIAGNOSIS — M25551 Pain in right hip: Secondary | ICD-10-CM | POA: Diagnosis not present

## 2020-01-19 DIAGNOSIS — M9903 Segmental and somatic dysfunction of lumbar region: Secondary | ICD-10-CM | POA: Diagnosis not present

## 2020-01-19 DIAGNOSIS — C029 Malignant neoplasm of tongue, unspecified: Secondary | ICD-10-CM | POA: Diagnosis not present

## 2020-01-19 DIAGNOSIS — M9905 Segmental and somatic dysfunction of pelvic region: Secondary | ICD-10-CM | POA: Diagnosis not present

## 2020-01-21 DIAGNOSIS — M25551 Pain in right hip: Secondary | ICD-10-CM | POA: Diagnosis not present

## 2020-01-21 DIAGNOSIS — M9903 Segmental and somatic dysfunction of lumbar region: Secondary | ICD-10-CM | POA: Diagnosis not present

## 2020-01-21 DIAGNOSIS — Q7649 Other congenital malformations of spine, not associated with scoliosis: Secondary | ICD-10-CM | POA: Diagnosis not present

## 2020-01-21 DIAGNOSIS — M9905 Segmental and somatic dysfunction of pelvic region: Secondary | ICD-10-CM | POA: Diagnosis not present

## 2020-01-25 DIAGNOSIS — Q7649 Other congenital malformations of spine, not associated with scoliosis: Secondary | ICD-10-CM | POA: Diagnosis not present

## 2020-01-25 DIAGNOSIS — M9903 Segmental and somatic dysfunction of lumbar region: Secondary | ICD-10-CM | POA: Diagnosis not present

## 2020-01-25 DIAGNOSIS — M25551 Pain in right hip: Secondary | ICD-10-CM | POA: Diagnosis not present

## 2020-01-25 DIAGNOSIS — M9905 Segmental and somatic dysfunction of pelvic region: Secondary | ICD-10-CM | POA: Diagnosis not present

## 2020-02-21 DIAGNOSIS — M9905 Segmental and somatic dysfunction of pelvic region: Secondary | ICD-10-CM | POA: Diagnosis not present

## 2020-02-21 DIAGNOSIS — M25551 Pain in right hip: Secondary | ICD-10-CM | POA: Diagnosis not present

## 2020-02-21 DIAGNOSIS — M9903 Segmental and somatic dysfunction of lumbar region: Secondary | ICD-10-CM | POA: Diagnosis not present

## 2020-02-21 DIAGNOSIS — Q7649 Other congenital malformations of spine, not associated with scoliosis: Secondary | ICD-10-CM | POA: Diagnosis not present

## 2020-02-25 DIAGNOSIS — C029 Malignant neoplasm of tongue, unspecified: Secondary | ICD-10-CM | POA: Diagnosis not present

## 2020-02-29 DIAGNOSIS — I700 Atherosclerosis of aorta: Secondary | ICD-10-CM | POA: Diagnosis not present

## 2020-02-29 DIAGNOSIS — Z0000 Encounter for general adult medical examination without abnormal findings: Secondary | ICD-10-CM | POA: Diagnosis not present

## 2020-02-29 DIAGNOSIS — C760 Malignant neoplasm of head, face and neck: Secondary | ICD-10-CM | POA: Diagnosis not present

## 2020-02-29 DIAGNOSIS — C023 Malignant neoplasm of anterior two-thirds of tongue, part unspecified: Secondary | ICD-10-CM | POA: Diagnosis not present

## 2020-04-29 DIAGNOSIS — R55 Syncope and collapse: Secondary | ICD-10-CM | POA: Diagnosis not present

## 2020-04-29 DIAGNOSIS — M62830 Muscle spasm of back: Secondary | ICD-10-CM | POA: Diagnosis not present

## 2020-05-04 DIAGNOSIS — M9903 Segmental and somatic dysfunction of lumbar region: Secondary | ICD-10-CM | POA: Diagnosis not present

## 2020-05-04 DIAGNOSIS — M47816 Spondylosis without myelopathy or radiculopathy, lumbar region: Secondary | ICD-10-CM | POA: Diagnosis not present

## 2020-05-04 DIAGNOSIS — M5136 Other intervertebral disc degeneration, lumbar region: Secondary | ICD-10-CM | POA: Diagnosis not present

## 2020-05-04 DIAGNOSIS — M9905 Segmental and somatic dysfunction of pelvic region: Secondary | ICD-10-CM | POA: Diagnosis not present

## 2020-05-04 DIAGNOSIS — M25551 Pain in right hip: Secondary | ICD-10-CM | POA: Diagnosis not present

## 2020-05-04 DIAGNOSIS — Q7649 Other congenital malformations of spine, not associated with scoliosis: Secondary | ICD-10-CM | POA: Diagnosis not present

## 2020-05-04 DIAGNOSIS — M438X6 Other specified deforming dorsopathies, lumbar region: Secondary | ICD-10-CM | POA: Diagnosis not present

## 2020-05-08 DIAGNOSIS — M25551 Pain in right hip: Secondary | ICD-10-CM | POA: Diagnosis not present

## 2020-05-08 DIAGNOSIS — M9905 Segmental and somatic dysfunction of pelvic region: Secondary | ICD-10-CM | POA: Diagnosis not present

## 2020-05-08 DIAGNOSIS — Q7649 Other congenital malformations of spine, not associated with scoliosis: Secondary | ICD-10-CM | POA: Diagnosis not present

## 2020-05-08 DIAGNOSIS — M9903 Segmental and somatic dysfunction of lumbar region: Secondary | ICD-10-CM | POA: Diagnosis not present

## 2020-05-09 DIAGNOSIS — C023 Malignant neoplasm of anterior two-thirds of tongue, part unspecified: Secondary | ICD-10-CM | POA: Diagnosis not present

## 2020-05-10 ENCOUNTER — Encounter: Payer: BC Managed Care – PPO | Admitting: Sports Medicine

## 2020-05-12 ENCOUNTER — Ambulatory Visit (INDEPENDENT_AMBULATORY_CARE_PROVIDER_SITE_OTHER): Payer: BC Managed Care – PPO | Admitting: Sports Medicine

## 2020-05-12 ENCOUNTER — Other Ambulatory Visit: Payer: Self-pay

## 2020-05-12 DIAGNOSIS — M5137 Other intervertebral disc degeneration, lumbosacral region: Secondary | ICD-10-CM | POA: Diagnosis not present

## 2020-05-12 DIAGNOSIS — Q7649 Other congenital malformations of spine, not associated with scoliosis: Secondary | ICD-10-CM | POA: Diagnosis not present

## 2020-05-12 DIAGNOSIS — M4807 Spinal stenosis, lumbosacral region: Secondary | ICD-10-CM

## 2020-05-12 DIAGNOSIS — M9903 Segmental and somatic dysfunction of lumbar region: Secondary | ICD-10-CM | POA: Diagnosis not present

## 2020-05-12 DIAGNOSIS — M51379 Other intervertebral disc degeneration, lumbosacral region without mention of lumbar back pain or lower extremity pain: Secondary | ICD-10-CM

## 2020-05-12 DIAGNOSIS — M25551 Pain in right hip: Secondary | ICD-10-CM | POA: Diagnosis not present

## 2020-05-12 DIAGNOSIS — M9905 Segmental and somatic dysfunction of pelvic region: Secondary | ICD-10-CM | POA: Diagnosis not present

## 2020-05-12 MED ORDER — PREDNISONE 50 MG PO TABS: ORAL_TABLET | ORAL | 0 refills | Status: DC

## 2020-05-12 NOTE — Progress Notes (Signed)
    Procedures performed today:    None.  Independent interpretation of notes and tests performed by another provider:   None.  Brief History, Exam, Impression, and Recommendations:    DDD (degenerative disc disease), lumbosacral This is a pleasant 60 year old male, he has a history of tongue cancer necessitating partial glossectomy and radical neck dissection. He also has a history of an L2-L3 posterior fusion. More recently he has been having increasing pain in his low back with radiation into the left buttock, typically not down past the knee. Better with flexion. Ultimate goal is to get back to fishing without discomfort. Clinically he does not have any tenderness on the lumbar spine, pain improves with flexion, no tenderness at the SI joint. Clinically this sounds like lumbar spinal stenosis, he already had x-rays that showed multilevel DDD as well as a stable fusion. Due to the history of cancer we are going to proceed with MRI, also adding 5 days of prednisone and formal physical therapy. Return to see me in 6 weeks, injections if no better    ___________________________________________ Gwen Her. Dianah Field, M.D., ABFM., CAQSM. Primary Care and [place] Instructor of [place] of [facility] of Medicine

## 2020-05-12 NOTE — Assessment & Plan Note (Addendum)
This is a pleasant 60 year old male, he has a history of tongue cancer necessitating partial glossectomy and radical neck dissection. He also has a history of an L2-L3 posterior fusion. More recently he has been having increasing pain in his low back with radiation into the left buttock, typically not down past the knee. Better with flexion. Ultimate goal is to get back to fishing without discomfort. Clinically he does not have any tenderness on the lumbar spine, pain improves with flexion, no tenderness at the SI joint. Clinically this sounds like lumbar spinal stenosis, he already had x-rays that showed multilevel DDD as well as a stable fusion. Due to the history of cancer we are going to proceed with MRI, also adding 5 days of prednisone and formal physical therapy. Return to see me in 6 weeks, injections if no better

## 2020-05-15 ENCOUNTER — Ambulatory Visit (INDEPENDENT_AMBULATORY_CARE_PROVIDER_SITE_OTHER): Payer: BC Managed Care – PPO | Admitting: Rehabilitative and Restorative Service Providers"

## 2020-05-15 ENCOUNTER — Other Ambulatory Visit: Payer: Self-pay

## 2020-05-15 ENCOUNTER — Encounter: Payer: Self-pay | Admitting: Rehabilitative and Restorative Service Providers"

## 2020-05-15 DIAGNOSIS — M9905 Segmental and somatic dysfunction of pelvic region: Secondary | ICD-10-CM | POA: Diagnosis not present

## 2020-05-15 DIAGNOSIS — R29898 Other symptoms and signs involving the musculoskeletal system: Secondary | ICD-10-CM | POA: Diagnosis not present

## 2020-05-15 DIAGNOSIS — M6281 Muscle weakness (generalized): Secondary | ICD-10-CM

## 2020-05-15 DIAGNOSIS — M25551 Pain in right hip: Secondary | ICD-10-CM | POA: Diagnosis not present

## 2020-05-15 DIAGNOSIS — M5440 Lumbago with sciatica, unspecified side: Secondary | ICD-10-CM | POA: Diagnosis not present

## 2020-05-15 DIAGNOSIS — M9903 Segmental and somatic dysfunction of lumbar region: Secondary | ICD-10-CM | POA: Diagnosis not present

## 2020-05-15 DIAGNOSIS — Q7649 Other congenital malformations of spine, not associated with scoliosis: Secondary | ICD-10-CM | POA: Diagnosis not present

## 2020-05-15 NOTE — Patient Instructions (Signed)
Access Code: [email or web address] URL: [email or web address] Date: 05/15/2020 Prepared by: Rudell Cobb  Exercises Supine Heel Slide - 2 x daily - 7 x weekly - 1 sets - 5 reps Plank with Hands on Table - 2 x daily - 7 x weekly - 1 sets - 3 reps - 20 seconds hold Seated Flexion Stretch - 2 x daily - 7 x weekly - 1 sets - 5 reps - 20-30 seconds hold

## 2020-05-15 NOTE — Therapy (Addendum)
[place], [place], [address] Phone: [phone]   Fax:  [phone]  Physical Therapy Evaluation/Discharge Summary  Patient Details  Name: Nathan Stephenson MRN: [id number] Date of Birth: 1960/07/01 Referring Provider (PT): Aundria Mems, MD    Patient did not return to PT-- he canceled PT sessions due to going to chiropractor.  Please refer to initial evaluation for patient status. Thank you for the referral of this patient. Rudell Cobb, MPT   Encounter Date: 05/15/2020   PT End of Session - 05/15/20 1622    Visit Number 1    Number of Visits 12    Date for PT Re-Evaluation 06/26/20    Authorization Type BCBS    PT Start Time 1520    PT Stop Time 1615    PT Time Calculation (min) 55 min    Activity Tolerance Patient limited by pain    Behavior During Therapy [facility] for tasks assessed/performed           Past Medical History:  Diagnosis Date  . Low back pain   . Personal history of colonic polyps 03/09/2012  . Radicular pain    neck  . Tongue cancer [facility])     Past Surgical History:  Procedure Laterality Date  . LIPOMA EXCISION  09/2002  . LUMBAR FUSION     L2-L3  . PARTIAL GLOSSECTOMY    . SPINE SURGERY    . VASECTOMY      There were no vitals filed for this visit.    Subjective Assessment - 05/15/20 1524    Subjective The patient reports chronic low back pain that returned 3 weeks ago and varies from 4/10 up to 15/10 when it is bad.  L SI pain point tender and radiates all the way down to the knee, sometimes it will go to the foot.    Pertinent History L2-L3 spinal fusion 2017, h/o chronic back pain, h/o tongue cancer 06/2019, multilevel DDD    Patient Stated Goals reduce pain    Currently in Pain? Yes    Pain Score 10-Worst pain ever   15/10   Pain Location Back    Pain Orientation Left    Pain Descriptors / Indicators Sharp;Shooting;Sore;Stabbing    Pain Type Chronic  pain;Acute pain    Pain Radiating Towards radiates to knee, sometimes to foot    Pain Onset 1 to 4 weeks ago    Pain Frequency Constant    Aggravating Factors  hurts all day    Pain Relieving Factors baclofen (taking father in law's prescription)              [facility] PT Assessment - 05/15/20 1538      Assessment   Medical Diagnosis lumbar DDD    Referring Provider (PT) Aundria Mems, MD    Onset Date/Surgical Date 05/12/20    Hand Dominance Left    Prior Therapy no PT, chiropractor      Precautions   Precautions Other (comment)    Precaution Comments finished radiation 4 months ago      Restrictions   Weight Bearing Restrictions No      Balance Screen   Has the patient fallen in the past 6 months No    Has the patient had a decrease in activity level because of a fear of falling?  No    Is the patient reluctant to leave their home because of a fear of falling?  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     Observation/Other Assessments   Focus on Therapeutic Outcomes (FOTO)  39%      Sensation   Light Touch Appears Intact    Additional Comments except thigh is numb from the fusion surgery      ROM / Strength   AROM / PROM / Strength AROM;Strength      AROM   Overall AROM  Deficits    AROM Assessment Site Lumbar    Lumbar Flexion 50% limited   no increase in pain   Lumbar Extension 50% limited   some pain central   Lumbar - Right Side Bend 50% limited    Lumbar - Left Side Bend 50% limited    Lumbar - Right Rotation 25% limited   increases pain   Lumbar - Left Rotation 25% limited   increases pain     Strength   Overall Strength Deficits    Strength Assessment Site Hip;Knee;Ankle    Right/Left Hip Right;Left    Right Hip Flexion 5/5    Left Hip Flexion 2/5   unable to  lift due to 15/10 pain   Right/Left Knee Right;Left    Right Knee Flexion 5/5    Right Knee Extension 5/5    Left Knee Flexion 4/5    Left Knee Extension 4/5    Right/Left Ankle Right;Left    Right Ankle Dorsiflexion 5/5    Left Ankle Dorsiflexion 4+/5      Flexibility   Soft Tissue Assessment /Muscle Length yes    Hamstrings equal bilaterally with minimal tightness    Quadriceps tightness - increases pain in L SI      Palpation   Spinal mobility hypombility L5-S1 CPA, L UPA    SI assessment  sacral tightness    Palpation comment point tender palpation L SI region                      Objective measurements completed on examination: See above findings.       [facility] Adult PT Treatment/Exercise - 05/15/20 1631      Exercises   Exercises Lumbar      Lumbar Exercises: Stretches   Other Lumbar Stretch Exercise supine lumbar rotation x 10 reps with mild increase in pain      Lumbar Exercises: Supine   Heel Slides 5 reps    Heel Slides Limitations gets some increase in pain moving into flexion; recommended try for HEP to tolerance    Bent Knee Raise Limitations *unable to do supine march    Other Supine Lumbar Exercises bent knee fallouts significantly increase pain      Lumbar Exercises: Prone   Other Prone Lumbar Exercises prone on elbows significantly increases pain      Manual Therapy   Manual Therapy Joint mobilization    Manual therapy comments to improve mobility    Joint Mobilization CPA L5/S1 and sacral CPA and L UPA mobs grade I-II                  PT Education - 05/15/20 1623    Education Details HEP    Person(s) Educated Patient    Methods Explanation;Demonstration;Handout    Comprehension Verbalized understanding;Returned demonstration  PT Long Term Goals - 05/15/20 1623      PT LONG TERM GOAL #1   Title The patient will be indep with HEP.    Time 6    Period Weeks    Target Date 06/26/20      PT LONG TERM  GOAL #2   Title The patient will improve functional status score from 39 % up to 64%.    Time 6    Period Weeks    Target Date 06/26/20      PT LONG TERM GOAL #3   Title The patient will report pain at it's worst 4/10.    Baseline 15/10.    Time 6    Period Weeks    Target Date 06/26/20      PT LONG TERM GOAL #4   Title The patient will improve L hip flexor strength to 4+/5.    Baseline *Unable to tolerate lifting L LE against gravity in sitting.    Time 6    Period Weeks    Target Date 06/26/20      PT LONG TERM GOAL #5   Title The patient will be able to ambulate to his pond and back with back pain < or equal to 2/10.    Time 6    Period Weeks    Target Date 06/26/20                  Plan - 05/15/20 1548    Clinical Impression Statement The patient is a 60 yo male presenting to OP physical therapy with acute on chronic low back pain.  He has h/o lumbar fusion L2-L3 in 2017.  He notes acute onset LBP with radiating symptoms to the L LE (at times to the foot) and severe pain that ranges from 3/10 up to 15/10.    Personal Factors and Comorbidities Comorbidity 3+    Comorbidities h/o lumbar fusion, h/o recent radiation s/p tongue cancer, lumbar DDD    Examination-Activity Limitations Locomotion Level;Bed Mobility;Squat;Stairs;Stand;Lift    Examination-Participation Restrictions Community Activity;Yard Work    Stability/Clinical Decision Making Stable/Uncomplicated    Designer, jewellery Low    Rehab Potential Good    PT Frequency 2x / week    PT Duration 6 weeks    PT Treatment/Interventions ADLs/Self Care Home Management;Taping;Patient/family education;Gait training;Stair training;Functional mobility training;Therapeutic activities;Therapeutic exercise;Electrical Stimulation;Moist Heat;Aquatic Therapy;Cryotherapy;Neuromuscular re-education;Passive range of motion    PT Next Visit Plan Patient does not tolerate mask in PT today, attempt q-ped gentle AROM lumbar  spine, standing stabilization, hip flexor stretching to tolerance, DN/STM and modalities PRN    PT Home Exercise Plan Access Code: [email or web address]    Consulted and Agree with Plan of Care Patient           Patient will benefit from skilled therapeutic intervention in order to improve the following deficits and impairments:  Pain,Hypomobility,Impaired flexibility,Postural dysfunction,Decreased strength,Difficulty walking,Decreased range of motion,Increased fascial restricitons,Decreased activity tolerance  Visit Diagnosis: Acute left-sided low back pain with sciatica, sciatica laterality unspecified  Muscle weakness (generalized)  Other symptoms and signs involving the musculoskeletal system     Problem List Patient Active Problem List   Diagnosis Date Noted  . Bradycardia 09/02/2019  . Dizziness 09/02/2019  . Oral lesion 09/02/2019  . Borderline high serum cholesterol 05/15/2016  . Class 1 obesity due to excess calories without serious comorbidity with body mass index (BMI) of 34.0 to 34.9 in adult 05/15/2016  . Degenerative scoliosis in adult patient 05/05/2015  .  DDD (degenerative disc disease), lumbosacral 02/28/2015  . Personal history of colonic polyps 03/09/2012  . Actinic keratosis 03/09/2012  . CERVICAL RADICULOPATHY 07/21/2006    Rami Budhu, PT 05/15/2020, 4:33 PM  [facility] [place], [place], [address] Phone: [phone]   Fax:  [phone]  Name: Nathan Stephenson MRN: [id number] Date of Birth: 16-Apr-1960

## 2020-05-17 ENCOUNTER — Ambulatory Visit (INDEPENDENT_AMBULATORY_CARE_PROVIDER_SITE_OTHER): Payer: BC Managed Care – PPO | Admitting: Sports Medicine

## 2020-05-17 ENCOUNTER — Other Ambulatory Visit: Payer: Self-pay

## 2020-05-17 DIAGNOSIS — M25551 Pain in right hip: Secondary | ICD-10-CM | POA: Diagnosis not present

## 2020-05-17 DIAGNOSIS — M51379 Other intervertebral disc degeneration, lumbosacral region without mention of lumbar back pain or lower extremity pain: Secondary | ICD-10-CM

## 2020-05-17 DIAGNOSIS — M5137 Other intervertebral disc degeneration, lumbosacral region: Secondary | ICD-10-CM

## 2020-05-17 DIAGNOSIS — M9905 Segmental and somatic dysfunction of pelvic region: Secondary | ICD-10-CM | POA: Diagnosis not present

## 2020-05-17 DIAGNOSIS — M9903 Segmental and somatic dysfunction of lumbar region: Secondary | ICD-10-CM | POA: Diagnosis not present

## 2020-05-17 DIAGNOSIS — Q7649 Other congenital malformations of spine, not associated with scoliosis: Secondary | ICD-10-CM | POA: Diagnosis not present

## 2020-05-17 MED ORDER — OXYCODONE-ACETAMINOPHEN 10-325 MG PO TABS: 1.0000 | ORAL_TABLET | Freq: Three times a day (TID) | ORAL | 0 refills | Status: AC | PRN

## 2020-05-17 NOTE — Assessment & Plan Note (Signed)
Nathan Stephenson returns, he is a pleasant 60 year old male, history of tongue cancer necessitating partial glossectomy and a radical neck dissection, he also has a history of an L2-L3 posterior fusion. Increasing pain in the low back with radiation to the left buttock but not past the knee, better with flexion. We did start with some steroids and physical therapy, unfortunately this has not helped to any degree and he is having severe worsening of pain. He is seeing a chiropractor which does seem to help to some degree. Due to his history of cancer he does have an MRI scheduled, I am going to add high-dose oxycodone to hold him over in the meantime, he will return for MRI follow-up and we will plan an epidural.  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

## 2020-05-17 NOTE — Progress Notes (Signed)
    Procedures performed today:    None.  Independent interpretation of notes and tests performed by another provider:   None.  Brief History, Exam, Impression, and Recommendations:    DDD (degenerative disc disease), lumbosacral Nathan Stephenson returns, he is a pleasant 60 year old male, history of tongue cancer necessitating partial glossectomy and a radical neck dissection, he also has a history of an L2-L3 posterior fusion. Increasing pain in the low back with radiation to the left buttock but not past the knee, better with flexion. We did start with some steroids and physical therapy, unfortunately this has not helped to any degree and he is having severe worsening of pain. He is seeing a chiropractor which does seem to help to some degree. Due to his history of cancer he does have an MRI scheduled, I am going to add high-dose oxycodone to hold him over in the meantime, he will return for MRI follow-up and we will plan an epidural.  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    ___________________________________________ Gwen Her. Dianah Field, M.D., ABFM., CAQSM. Primary Care and [place] Instructor of [place] of [facility] of Medicine

## 2020-05-18 ENCOUNTER — Encounter: Payer: BC Managed Care – PPO | Admitting: Rehabilitative and Restorative Service Providers"

## 2020-05-18 ENCOUNTER — Telehealth: Payer: Self-pay | Admitting: Rehabilitative and Restorative Service Providers"

## 2020-05-18 NOTE — Telephone Encounter (Signed)
Nathan Stephenson was scheduled for PT appointment this morning and did not show for appt. Spoke with him on the phone and he states that he would like to cancel all scheduled appointments and continue with chiropractic care only for now. He will notify us if he wishes to schedule any further PT appointments going forward.   Tamaira Ciriello P. Helene Kelp PT, MPH 05/18/20 12:58 PM

## 2020-05-19 DIAGNOSIS — M9903 Segmental and somatic dysfunction of lumbar region: Secondary | ICD-10-CM | POA: Diagnosis not present

## 2020-05-19 DIAGNOSIS — M25551 Pain in right hip: Secondary | ICD-10-CM | POA: Diagnosis not present

## 2020-05-19 DIAGNOSIS — M9905 Segmental and somatic dysfunction of pelvic region: Secondary | ICD-10-CM | POA: Diagnosis not present

## 2020-05-19 DIAGNOSIS — Q7649 Other congenital malformations of spine, not associated with scoliosis: Secondary | ICD-10-CM | POA: Diagnosis not present

## 2020-05-20 ENCOUNTER — Ambulatory Visit (INDEPENDENT_AMBULATORY_CARE_PROVIDER_SITE_OTHER): Payer: BC Managed Care – PPO

## 2020-05-20 ENCOUNTER — Other Ambulatory Visit: Payer: Self-pay

## 2020-05-20 ENCOUNTER — Other Ambulatory Visit: Payer: BC Managed Care – PPO

## 2020-05-20 DIAGNOSIS — M545 Low back pain, unspecified: Secondary | ICD-10-CM | POA: Diagnosis not present

## 2020-05-20 DIAGNOSIS — M4807 Spinal stenosis, lumbosacral region: Secondary | ICD-10-CM

## 2020-05-20 DIAGNOSIS — M5137 Other intervertebral disc degeneration, lumbosacral region: Secondary | ICD-10-CM | POA: Diagnosis not present

## 2020-05-22 ENCOUNTER — Ambulatory Visit: Payer: BC Managed Care – PPO | Admitting: Sports Medicine

## 2020-05-23 ENCOUNTER — Other Ambulatory Visit: Payer: Self-pay

## 2020-05-23 ENCOUNTER — Encounter: Payer: BC Managed Care – PPO | Admitting: Physical Therapy

## 2020-05-23 ENCOUNTER — Ambulatory Visit (INDEPENDENT_AMBULATORY_CARE_PROVIDER_SITE_OTHER): Payer: BC Managed Care – PPO | Admitting: Sports Medicine

## 2020-05-23 DIAGNOSIS — M51379 Other intervertebral disc degeneration, lumbosacral region without mention of lumbar back pain or lower extremity pain: Secondary | ICD-10-CM

## 2020-05-23 DIAGNOSIS — M5137 Other intervertebral disc degeneration, lumbosacral region: Secondary | ICD-10-CM | POA: Diagnosis not present

## 2020-05-23 MED ORDER — GABAPENTIN 300 MG PO CAPS: ORAL_CAPSULE | ORAL | 3 refills | Status: AC

## 2020-05-23 NOTE — Assessment & Plan Note (Addendum)
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 Chronic process not at goal. Continue meloxicam, gabapentin seems to be working well, increasing to 300 mg 2-3 times a day. We are also going to go ahead and order a left L4-L5 interlaminar epidural. Return to see me 1 month after the injection.

## 2020-05-23 NOTE — Progress Notes (Signed)
    Procedures performed today:    None.  Independent interpretation of notes and tests performed by another provider:   MRI personally reviewed, the fused level looks good, there is however some central canal stenosis at L4-L5 based on the numbering scheme on the MRI.  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 Chronic process not at goal. Continue meloxicam, gabapentin seems to be working well, increasing to 300 mg 2-3 times a day. We are also going to go ahead and order a left L4-L5 interlaminar epidural. Return to see me 1 month after the injection.    ___________________________________________ Gwen Her. Dianah Field, M.D., ABFM., CAQSM. Primary Care and [place] Instructor of [place] of [facility] of Medicine

## 2020-05-25 ENCOUNTER — Encounter: Payer: BC Managed Care – PPO | Admitting: Rehabilitative and Restorative Service Providers"

## 2020-05-26 ENCOUNTER — Ambulatory Visit
Admission: RE | Admit: 2020-05-26 | Discharge: 2020-05-26 | Disposition: A | Discharge status: Home / Self Care | Payer: BC Managed Care – PPO | Source: Ambulatory Visit | Attending: Sports Medicine | Admitting: Sports Medicine

## 2020-05-26 ENCOUNTER — Telehealth: Payer: Self-pay

## 2020-05-26 ENCOUNTER — Other Ambulatory Visit: Payer: Self-pay

## 2020-05-26 DIAGNOSIS — M47817 Spondylosis without myelopathy or radiculopathy, lumbosacral region: Secondary | ICD-10-CM | POA: Diagnosis not present

## 2020-05-26 DIAGNOSIS — M5412 Radiculopathy, cervical region: Secondary | ICD-10-CM

## 2020-05-26 DIAGNOSIS — M5137 Other intervertebral disc degeneration, lumbosacral region: Secondary | ICD-10-CM

## 2020-05-26 MED ORDER — METHYLPREDNISOLONE ACETATE 40 MG/ML INJ SUSP (RADIOLOG
120.0000 mg | Freq: Once | INTRAMUSCULAR | Status: AC
  Administered 2020-05-26: 120 mg via EPIDURAL

## 2020-05-26 MED ORDER — TRIAZOLAM 0.25 MG PO TABS: ORAL_TABLET | ORAL | 0 refills | Status: AC

## 2020-05-26 MED ORDER — IOPAMIDOL (ISOVUE-M 200) INJECTION 41%
1.0000 mL | Freq: Once | INTRAMUSCULAR | Status: AC
  Administered 2020-05-26: 1 mL via EPIDURAL

## 2020-05-26 MED ORDER — DIAZEPAM 5 MG PO TABS
5.0000 mg | ORAL_TABLET | Freq: Once | ORAL | Status: AC
  Administered 2020-05-26: 5 mg via ORAL

## 2020-05-26 NOTE — Telephone Encounter (Signed)
Patient report you were going to call in something to take the edge off for the spinal injection. This never got sent in. He is scheduled for today. Please send in.

## 2020-05-26 NOTE — Telephone Encounter (Signed)
Triazolam sent in.

## 2020-05-26 NOTE — Telephone Encounter (Signed)
Left message for patient that prescription has been sent in

## 2020-05-26 NOTE — Discharge Instructions (Signed)

## 2020-05-30 ENCOUNTER — Encounter: Payer: BC Managed Care – PPO | Admitting: Physical Therapy

## 2020-06-01 ENCOUNTER — Encounter: Payer: BC Managed Care – PPO | Admitting: Physical Therapy

## 2020-06-05 DIAGNOSIS — M9903 Segmental and somatic dysfunction of lumbar region: Secondary | ICD-10-CM | POA: Diagnosis not present

## 2020-06-05 DIAGNOSIS — Q7649 Other congenital malformations of spine, not associated with scoliosis: Secondary | ICD-10-CM | POA: Diagnosis not present

## 2020-06-05 DIAGNOSIS — M9905 Segmental and somatic dysfunction of pelvic region: Secondary | ICD-10-CM | POA: Diagnosis not present

## 2020-06-05 DIAGNOSIS — M25551 Pain in right hip: Secondary | ICD-10-CM | POA: Diagnosis not present

## 2020-06-05 DIAGNOSIS — C029 Malignant neoplasm of tongue, unspecified: Secondary | ICD-10-CM | POA: Diagnosis not present

## 2020-06-06 ENCOUNTER — Encounter: Payer: BC Managed Care – PPO | Admitting: Rehabilitative and Restorative Service Providers"

## 2020-06-08 ENCOUNTER — Encounter: Payer: BC Managed Care – PPO | Admitting: Rehabilitative and Restorative Service Providers"

## 2020-06-13 ENCOUNTER — Encounter: Payer: BC Managed Care – PPO | Admitting: Physical Therapy

## 2020-06-15 ENCOUNTER — Encounter: Payer: BC Managed Care – PPO | Admitting: Rehabilitative and Restorative Service Providers"

## 2020-06-20 ENCOUNTER — Ambulatory Visit: Payer: BC Managed Care – PPO | Admitting: Sports Medicine

## 2020-06-23 ENCOUNTER — Ambulatory Visit: Payer: BC Managed Care – PPO | Admitting: Sports Medicine

## 2020-08-08 DIAGNOSIS — C023 Malignant neoplasm of anterior two-thirds of tongue, part unspecified: Secondary | ICD-10-CM | POA: Diagnosis not present

## 2020-08-22 DIAGNOSIS — Z114 Encounter for screening for human immunodeficiency virus [HIV]: Secondary | ICD-10-CM | POA: Diagnosis not present

## 2020-08-22 DIAGNOSIS — T23002A Burn of unspecified degree of left hand, unspecified site, initial encounter: Secondary | ICD-10-CM | POA: Diagnosis not present

## 2020-08-22 DIAGNOSIS — Z125 Encounter for screening for malignant neoplasm of prostate: Secondary | ICD-10-CM | POA: Diagnosis not present

## 2020-08-22 DIAGNOSIS — T300 Burn of unspecified body region, unspecified degree: Secondary | ICD-10-CM | POA: Diagnosis not present

## 2020-08-22 DIAGNOSIS — Z23 Encounter for immunization: Secondary | ICD-10-CM | POA: Diagnosis not present

## 2020-08-22 DIAGNOSIS — M549 Dorsalgia, unspecified: Secondary | ICD-10-CM | POA: Diagnosis not present

## 2020-08-22 DIAGNOSIS — M170 Bilateral primary osteoarthritis of knee: Secondary | ICD-10-CM | POA: Diagnosis not present

## 2020-08-22 DIAGNOSIS — X150XXA Contact with hot stove (kitchen), initial encounter: Secondary | ICD-10-CM | POA: Diagnosis not present

## 2020-08-22 DIAGNOSIS — H6122 Impacted cerumen, left ear: Secondary | ICD-10-CM | POA: Diagnosis not present

## 2020-08-23 DIAGNOSIS — L570 Actinic keratosis: Secondary | ICD-10-CM | POA: Diagnosis not present

## 2020-08-23 DIAGNOSIS — D235 Other benign neoplasm of skin of trunk: Secondary | ICD-10-CM | POA: Diagnosis not present

## 2020-08-23 DIAGNOSIS — Z85828 Personal history of other malignant neoplasm of skin: Secondary | ICD-10-CM | POA: Diagnosis not present

## 2020-09-27 DIAGNOSIS — M1711 Unilateral primary osteoarthritis, right knee: Secondary | ICD-10-CM | POA: Diagnosis not present

## 2020-10-02 DIAGNOSIS — H6123 Impacted cerumen, bilateral: Secondary | ICD-10-CM | POA: Diagnosis not present

## 2020-10-10 DIAGNOSIS — R42 Dizziness and giddiness: Secondary | ICD-10-CM | POA: Diagnosis not present

## 2020-10-10 DIAGNOSIS — H9313 Tinnitus, bilateral: Secondary | ICD-10-CM | POA: Diagnosis not present

## 2020-10-10 DIAGNOSIS — H903 Sensorineural hearing loss, bilateral: Secondary | ICD-10-CM | POA: Diagnosis not present

## 2020-10-11 DIAGNOSIS — R2681 Unsteadiness on feet: Secondary | ICD-10-CM | POA: Diagnosis not present

## 2020-10-11 DIAGNOSIS — C029 Malignant neoplasm of tongue, unspecified: Secondary | ICD-10-CM | POA: Diagnosis not present

## 2020-10-18 DIAGNOSIS — M9903 Segmental and somatic dysfunction of lumbar region: Secondary | ICD-10-CM | POA: Diagnosis not present

## 2020-10-18 DIAGNOSIS — Q7649 Other congenital malformations of spine, not associated with scoliosis: Secondary | ICD-10-CM | POA: Diagnosis not present

## 2020-10-18 DIAGNOSIS — M9905 Segmental and somatic dysfunction of pelvic region: Secondary | ICD-10-CM | POA: Diagnosis not present

## 2020-10-18 DIAGNOSIS — M25551 Pain in right hip: Secondary | ICD-10-CM | POA: Diagnosis not present

## 2020-10-20 DIAGNOSIS — M9905 Segmental and somatic dysfunction of pelvic region: Secondary | ICD-10-CM | POA: Diagnosis not present

## 2020-10-20 DIAGNOSIS — M9903 Segmental and somatic dysfunction of lumbar region: Secondary | ICD-10-CM | POA: Diagnosis not present

## 2020-10-20 DIAGNOSIS — Q7649 Other congenital malformations of spine, not associated with scoliosis: Secondary | ICD-10-CM | POA: Diagnosis not present

## 2020-10-20 DIAGNOSIS — M25551 Pain in right hip: Secondary | ICD-10-CM | POA: Diagnosis not present

## 2020-10-23 DIAGNOSIS — M9905 Segmental and somatic dysfunction of pelvic region: Secondary | ICD-10-CM | POA: Diagnosis not present

## 2020-10-23 DIAGNOSIS — M25551 Pain in right hip: Secondary | ICD-10-CM | POA: Diagnosis not present

## 2020-10-23 DIAGNOSIS — M9903 Segmental and somatic dysfunction of lumbar region: Secondary | ICD-10-CM | POA: Diagnosis not present

## 2020-10-23 DIAGNOSIS — Q7649 Other congenital malformations of spine, not associated with scoliosis: Secondary | ICD-10-CM | POA: Diagnosis not present

## 2020-12-05 DIAGNOSIS — C023 Malignant neoplasm of anterior two-thirds of tongue, part unspecified: Secondary | ICD-10-CM | POA: Diagnosis not present

## 2020-12-13 DIAGNOSIS — M1711 Unilateral primary osteoarthritis, right knee: Secondary | ICD-10-CM | POA: Diagnosis not present

## 2021-01-16 DIAGNOSIS — R42 Dizziness and giddiness: Secondary | ICD-10-CM | POA: Diagnosis not present

## 2021-01-22 DIAGNOSIS — C029 Malignant neoplasm of tongue, unspecified: Secondary | ICD-10-CM | POA: Diagnosis not present

## 2021-02-22 DIAGNOSIS — H8193 Unspecified disorder of vestibular function, bilateral: Secondary | ICD-10-CM | POA: Diagnosis not present

## 2021-03-20 DIAGNOSIS — M179 Osteoarthritis of knee, unspecified: Secondary | ICD-10-CM | POA: Diagnosis not present

## 2021-03-20 DIAGNOSIS — R682 Dry mouth, unspecified: Secondary | ICD-10-CM | POA: Diagnosis not present

## 2021-03-20 DIAGNOSIS — C029 Malignant neoplasm of tongue, unspecified: Secondary | ICD-10-CM | POA: Diagnosis not present

## 2021-03-20 DIAGNOSIS — M549 Dorsalgia, unspecified: Secondary | ICD-10-CM | POA: Diagnosis not present

## 2021-05-03 DIAGNOSIS — C023 Malignant neoplasm of anterior two-thirds of tongue, part unspecified: Secondary | ICD-10-CM | POA: Diagnosis not present

## 2021-07-23 DIAGNOSIS — R42 Dizziness and giddiness: Secondary | ICD-10-CM | POA: Diagnosis not present

## 2021-07-30 DIAGNOSIS — C029 Malignant neoplasm of tongue, unspecified: Secondary | ICD-10-CM | POA: Diagnosis not present

## 2021-09-24 DIAGNOSIS — M25512 Pain in left shoulder: Secondary | ICD-10-CM | POA: Diagnosis not present

## 2021-09-24 DIAGNOSIS — M4306 Spondylolysis, lumbar region: Secondary | ICD-10-CM | POA: Diagnosis not present

## 2021-09-24 DIAGNOSIS — M48061 Spinal stenosis, lumbar region without neurogenic claudication: Secondary | ICD-10-CM | POA: Diagnosis not present

## 2021-10-30 DIAGNOSIS — C023 Malignant neoplasm of anterior two-thirds of tongue, part unspecified: Secondary | ICD-10-CM | POA: Diagnosis not present

## 2021-11-30 DIAGNOSIS — J329 Chronic sinusitis, unspecified: Secondary | ICD-10-CM | POA: Diagnosis not present

## 2021-11-30 DIAGNOSIS — H6691 Otitis media, unspecified, right ear: Secondary | ICD-10-CM | POA: Diagnosis not present

## 2023-02-07 IMAGING — XA Imaging study
2 series · 2 of 2 positions shown · non-contrast
Comparison: none

CLINICAL DATA: 60-year-old male with a history of lumbosacral
spondylosis without myelopathy. He has a history of prior L2-L3
posterior lumbar interbody fusion with additional multilevel
degenerative disc disease. He presents for left L4-L5 trans laminar
epidural steroid injection.

[Series 1: ortho standard · 1 of 1 slices shown (1 of 2)]
[im 1/1]
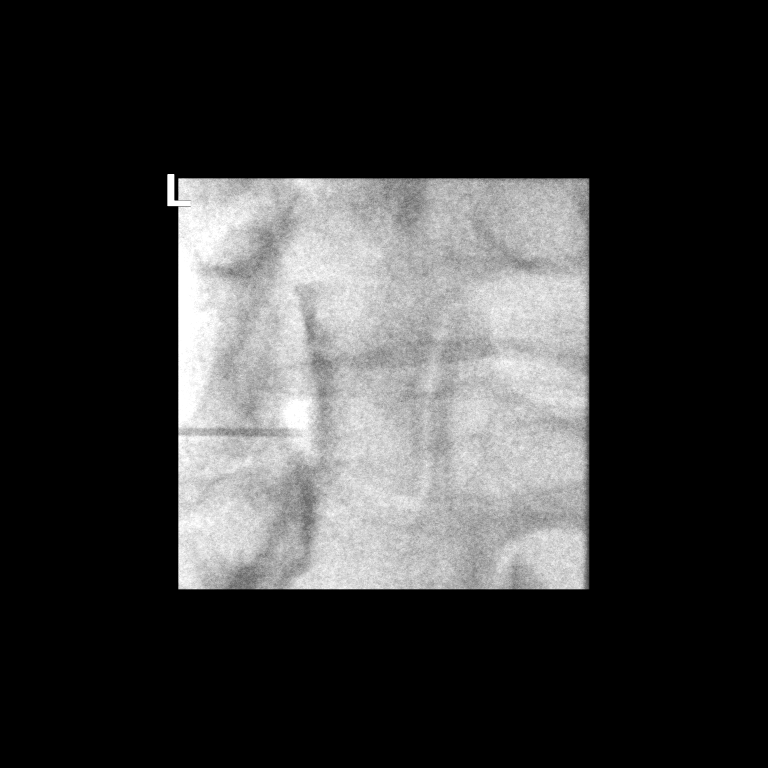

[Series 2: ortho standard · 1 of 1 slices shown (2 of 2)]
[im 1/1]
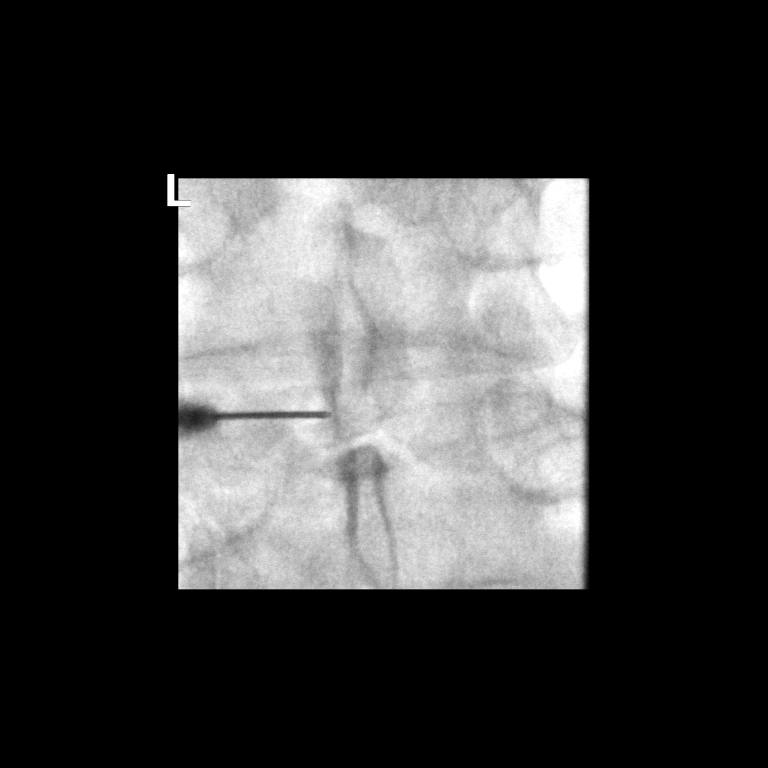

[2 of 2 positions shown; findings below may reference images not displayed]

FLUOROSCOPY TIME:  Dictate in minutes and seconds

PROCEDURE:
The procedure, risks, benefits, and alternatives were explained to
the patient. Questions regarding the procedure were encouraged and
answered. The patient understands and consents to the procedure.

LUMBAR EPIDURAL INJECTION:

An interlaminar approach was performed on left at L4-L5. The
overlying skin was cleansed and anesthetized. A 20 gauge epidural
needle was advanced using loss-of-resistance technique.

DIAGNOSTIC EPIDURAL INJECTION:

Injection of Isovue-M 200 shows a good epidural pattern with spread
above and below the level of needle placement, primarily on the left
no vascular opacification is seen.

THERAPEUTIC EPIDURAL INJECTION:

80 mg of Depo-Medrol mixed with 2 mL 1% lidocaine were instilled.
The procedure was well-tolerated, and the patient was discharged
thirty minutes following the injection in good condition.

COMPLICATIONS:
None immediate.
IMPRESSION: Technically successful epidural injection on the left L4-L5 #1.
